# Patient Record
Sex: Female | Born: 1975 | Race: White | Hispanic: No | Marital: Married | Smoking: Current every day smoker
Health system: Southern US, Community
[De-identification: ages and names within clinical notes are randomized; demographics above are authoritative.]

## PROBLEM LIST (undated history)

## (undated) ENCOUNTER — Emergency Department (HOSPITAL_COMMUNITY): Admission: EM | Payer: Self-pay | Source: Home / Self Care

## (undated) DIAGNOSIS — E079 Disorder of thyroid, unspecified: Secondary | ICD-10-CM

## (undated) DIAGNOSIS — R7989 Other specified abnormal findings of blood chemistry: Secondary | ICD-10-CM

## (undated) DIAGNOSIS — R32 Unspecified urinary incontinence: Secondary | ICD-10-CM

## (undated) DIAGNOSIS — N809 Endometriosis, unspecified: Secondary | ICD-10-CM

## (undated) DIAGNOSIS — N946 Dysmenorrhea, unspecified: Secondary | ICD-10-CM

## (undated) DIAGNOSIS — E559 Vitamin D deficiency, unspecified: Secondary | ICD-10-CM

## (undated) DIAGNOSIS — N8003 Adenomyosis of the uterus: Secondary | ICD-10-CM

## (undated) DIAGNOSIS — N979 Female infertility, unspecified: Secondary | ICD-10-CM

## (undated) DIAGNOSIS — N800 Endometriosis of uterus: Secondary | ICD-10-CM

## (undated) DIAGNOSIS — G510 Bell's palsy: Secondary | ICD-10-CM

## (undated) DIAGNOSIS — E785 Hyperlipidemia, unspecified: Secondary | ICD-10-CM

## (undated) DIAGNOSIS — R945 Abnormal results of liver function studies: Secondary | ICD-10-CM

## (undated) DIAGNOSIS — IMO0002 Reserved for concepts with insufficient information to code with codable children: Secondary | ICD-10-CM

## (undated) HISTORY — DX: Vitamin D deficiency, unspecified: E55.9

## (undated) HISTORY — PX: SALPINGECTOMY: SHX328

## (undated) HISTORY — DX: Endometriosis of uterus: N80.0

## (undated) HISTORY — DX: Hyperlipidemia, unspecified: E78.5

## (undated) HISTORY — DX: Disorder of thyroid, unspecified: E07.9

## (undated) HISTORY — DX: Bell's palsy: G51.0

## (undated) HISTORY — DX: Endometriosis, unspecified: N80.9

## (undated) HISTORY — DX: Reserved for concepts with insufficient information to code with codable children: IMO0002

## (undated) HISTORY — DX: Female infertility, unspecified: N97.9

## (undated) HISTORY — DX: Abnormal results of liver function studies: R94.5

## (undated) HISTORY — DX: Dysmenorrhea, unspecified: N94.6

## (undated) HISTORY — DX: Unspecified urinary incontinence: R32

## (undated) HISTORY — DX: Other specified abnormal findings of blood chemistry: R79.89

## (undated) HISTORY — DX: Adenomyosis of the uterus: N80.03

---

## 1995-12-18 HISTORY — PX: ECTOPIC PREGNANCY SURGERY: SHX613

## 1999-02-27 ENCOUNTER — Emergency Department (HOSPITAL_COMMUNITY): Admission: EM | Admit: 1999-02-27 | Discharge: 1999-02-27 | Payer: Self-pay | Admitting: Emergency Medicine

## 2010-12-17 LAB — HM PAP SMEAR

## 2011-06-27 ENCOUNTER — Emergency Department (HOSPITAL_COMMUNITY)
Admission: EM | Admit: 2011-06-27 | Discharge: 2011-06-28 | Disposition: A | Discharge status: Home / Self Care | Payer: BC Managed Care – PPO | Attending: Emergency Medicine | Admitting: Emergency Medicine

## 2011-06-27 ENCOUNTER — Emergency Department (HOSPITAL_COMMUNITY): Payer: Self-pay | Attending: Emergency Medicine

## 2011-06-27 DIAGNOSIS — E7800 Pure hypercholesterolemia, unspecified: Secondary | ICD-10-CM | POA: Insufficient documentation

## 2011-06-27 DIAGNOSIS — R0789 Other chest pain: Secondary | ICD-10-CM | POA: Insufficient documentation

## 2011-06-27 DIAGNOSIS — Z975 Presence of (intrauterine) contraceptive device: Secondary | ICD-10-CM | POA: Insufficient documentation

## 2011-06-27 DIAGNOSIS — E039 Hypothyroidism, unspecified: Secondary | ICD-10-CM | POA: Insufficient documentation

## 2011-06-27 DIAGNOSIS — F17200 Nicotine dependence, unspecified, uncomplicated: Secondary | ICD-10-CM | POA: Insufficient documentation

## 2011-06-27 LAB — DIFFERENTIAL
Basophils Absolute: 0.1 10*3/uL (ref 0.0–0.1)
Basophils Relative: 1 % (ref 0–1)
Eosinophils Absolute: 0.7 10*3/uL (ref 0.0–0.7)
Eosinophils Relative: 6 % — ABNORMAL HIGH (ref 0–5)
Monocytes Absolute: 0.9 10*3/uL (ref 0.1–1.0)
Monocytes Relative: 7 % (ref 3–12)

## 2011-06-28 ENCOUNTER — Emergency Department (HOSPITAL_COMMUNITY): Payer: BC Managed Care – PPO

## 2011-06-28 LAB — CK TOTAL AND CKMB (NOT AT ARMC)
CK, MB: 2 ng/mL (ref 0.3–4.0)
Total CK: 90 U/L (ref 7–177)

## 2011-06-28 LAB — CBC
MCH: 30.7 pg (ref 26.0–34.0)
MCHC: 34.7 g/dL (ref 30.0–36.0)
Platelets: 276 10*3/uL (ref 150–400)
RDW: 13.3 % (ref 11.5–15.5)

## 2011-06-28 LAB — BASIC METABOLIC PANEL
Calcium: 9.4 mg/dL (ref 8.4–10.5)
GFR calc Af Amer: >60 mL/min (ref >60)
GFR calc non Af Amer: >60 mL/min (ref >60)
Glucose, Bld: 111 mg/dL — ABNORMAL HIGH (ref 70–99)
Sodium: 140 mEq/L (ref 135–145)

## 2011-06-28 MED ORDER — IOHEXOL 300 MG/ML  SOLN
100.0000 mL | Freq: Once | INTRAMUSCULAR | Status: AC | PRN
  Administered 2011-06-28: 100 mL via INTRAVENOUS

## 2011-07-23 ENCOUNTER — Encounter (INDEPENDENT_AMBULATORY_CARE_PROVIDER_SITE_OTHER): Payer: Self-pay | Admitting: General Surgery

## 2011-07-23 ENCOUNTER — Ambulatory Visit (INDEPENDENT_AMBULATORY_CARE_PROVIDER_SITE_OTHER): Payer: BC Managed Care – PPO | Admitting: General Surgery

## 2011-07-23 VITALS — BP 120/82 | HR 68 | Temp 98.1°F | Ht 68.0 in | Wt 205.0 lb

## 2011-07-23 DIAGNOSIS — R109 Unspecified abdominal pain: Secondary | ICD-10-CM

## 2011-07-23 DIAGNOSIS — R1084 Generalized abdominal pain: Secondary | ICD-10-CM

## 2011-07-23 NOTE — Progress Notes (Signed)
Tammy Gibson is a 35 y.o. female.    Chief Complaint  Patient presents with  . Abdominal Pain    HPI HPI Is is a 35 year old female who is otherwise fairly healthy who has about an 8 month history of abdominal bloating, mucus in her stool, and worsening constipation. Previously she had bowel movements every 3 days this has changed over the last 8 months ago. She is also describes bloating that is very significant and causes her some diffuse abdominal cramping and pain every time she eats. She does associate some nausea and occasional emesis with this also. She has no fevers. She went on a diet previously lost about 8 pounds since then she has gained 18 pounds back. She was seen at the [facility] emergency room recently with what sounds like a right upper quadrant and chest pain. She underwent a CT angiogram for chest which was negative and was discharged home. She had been seen by her primary care physician Dr. Marina Goodell. He is sent her for an ultrasound which shows no abnormalities. She has no gallstones and there is no thickening of her gallbladder wall as well as her common bile duct is within normal limits. Subsequent to this she underwent a HIDA scan which shows an ejection fraction of 73%. There was no other real abnormalities she reports and the stomach report says that she had some mild cramping as well as some eructation. She comes in today to discuss having her gallbladder removed. She is not sure that this is her gallbladder and was questioning me as to why she would need her gallbladder out before I began discussing this with her.  Past Medical History  Diagnosis Date  . Thyroid disease   . Hyperlipidemia   . Abdominal pain     Past Surgical History  Procedure Date  . Ectopic pregnancy surgery     Family History  Problem Relation Age of Onset  . Hypertension Father   . Hyperlipidemia Father   . Thyroid disease Sister     Social History History  Substance Use Topics  . Smoking  status: Current Everyday Smoker -- 0.5 packs/day    Types: Cigarettes  . Smokeless tobacco: Not on file  . Alcohol Use: Yes    No Known Allergies  Current Outpatient Prescriptions  Medication Sig Dispense Refill  . levothyroxine (SYNTHROID, LEVOTHROID) 75 MCG tablet Take 75 mcg by mouth daily.        Marland Kitchen liothyronine (CYTOMEL) 25 MCG tablet Take 25 mcg by mouth 2 (two) times daily.        . simvastatin (ZOCOR) 20 MG tablet Take 20 mg by mouth at bedtime.          Review of Systems Review of Systems  Constitutional: Positive for malaise/fatigue. Negative for weight loss (weight gain).  HENT: Negative.   Eyes: Negative.   Respiratory: Negative.   Cardiovascular: Negative.   Gastrointestinal: Positive for abdominal pain and constipation.  Genitourinary: Negative.   Musculoskeletal: Negative.   Skin: Negative.   Neurological: Negative.   Endo/Heme/Allergies: Negative.   Psychiatric/Behavioral: Negative.     Physical Exam Physical Exam  Constitutional: She appears well-developed and well-nourished.  Eyes: No scleral icterus.  Cardiovascular: Normal rate, regular rhythm and normal heart sounds.   Respiratory: Effort normal and breath sounds normal. She has no wheezes. She has no rales.  GI: Soft. Bowel sounds are normal. She exhibits no distension and no mass. There is no tenderness.     Blood pressure  120/82, pulse 68, temperature 98.1 F (36.7 C), temperature source Temporal, height 5\' 8"  (1.727 m), weight 205 lb (92.987 kg).  Assessment/Plan Abdominal pain, bloating and constipation  She has a variety of different gastrointestinal complaints. There is a chance that certainly some of these might be referable to her gallbladder. She does however have normal ultrasound as well as in essentially normal HIDA scan also. I think the best course of action would be to have her seen by gastroenterology and I've made at referral prior to considering having a cholecystectomy at this  point. She was very happy with this plan I will see her back as needed if her symptoms do not improve or she did not able to find a cause of her symptoms.  I do think the episodes she had of right upper quadrant pain and chest pain and some of the symptoms that she has may be referable to her gallbladder and I discussed that with her today. I think it would be best for her to be evaluated by GI before proceeding with surgery.  Tammy Gibson 07/23/2011, 11:01 AM

## 2011-12-18 HISTORY — PX: IUD REMOVAL: SHX5392

## 2012-08-13 ENCOUNTER — Other Ambulatory Visit (HOSPITAL_COMMUNITY): Payer: Self-pay | Admitting: Internal Medicine

## 2012-08-13 ENCOUNTER — Ambulatory Visit (HOSPITAL_COMMUNITY)
Admission: RE | Admit: 2012-08-13 | Discharge: 2012-08-13 | Disposition: A | Discharge status: Home / Self Care | Payer: BC Managed Care – PPO | Source: Ambulatory Visit | Attending: Internal Medicine | Admitting: Internal Medicine

## 2012-08-13 DIAGNOSIS — F17200 Nicotine dependence, unspecified, uncomplicated: Secondary | ICD-10-CM

## 2012-08-13 DIAGNOSIS — I10 Essential (primary) hypertension: Secondary | ICD-10-CM

## 2012-08-14 ENCOUNTER — Other Ambulatory Visit: Payer: Self-pay | Admitting: Internal Medicine

## 2012-08-14 DIAGNOSIS — E0789 Other specified disorders of thyroid: Secondary | ICD-10-CM

## 2012-08-19 ENCOUNTER — Ambulatory Visit
Admission: RE | Admit: 2012-08-19 | Discharge: 2012-08-19 | Disposition: A | Discharge status: Home / Self Care | Payer: BC Managed Care – PPO | Source: Ambulatory Visit | Attending: Internal Medicine | Admitting: Internal Medicine

## 2012-08-19 DIAGNOSIS — E0789 Other specified disorders of thyroid: Secondary | ICD-10-CM

## 2013-10-02 ENCOUNTER — Encounter: Payer: Self-pay | Admitting: Internal Medicine

## 2013-10-20 ENCOUNTER — Other Ambulatory Visit: Payer: Self-pay

## 2013-10-21 ENCOUNTER — Other Ambulatory Visit: Payer: Self-pay | Admitting: Internal Medicine

## 2013-10-21 ENCOUNTER — Other Ambulatory Visit: Payer: BC Managed Care – PPO

## 2013-10-28 ENCOUNTER — Ambulatory Visit: Payer: BC Managed Care – PPO | Admitting: Physician Assistant

## 2013-10-28 ENCOUNTER — Encounter: Payer: Self-pay | Admitting: Physician Assistant

## 2013-10-28 VITALS — BP 112/68 | HR 88 | Temp 98.6°F | Resp 16 | Wt 196.4 lb

## 2013-10-28 DIAGNOSIS — H109 Unspecified conjunctivitis: Secondary | ICD-10-CM

## 2013-10-28 DIAGNOSIS — E039 Hypothyroidism, unspecified: Secondary | ICD-10-CM

## 2013-10-28 DIAGNOSIS — G510 Bell's palsy: Secondary | ICD-10-CM

## 2013-10-28 DIAGNOSIS — E559 Vitamin D deficiency, unspecified: Secondary | ICD-10-CM

## 2013-10-28 DIAGNOSIS — E785 Hyperlipidemia, unspecified: Secondary | ICD-10-CM

## 2013-10-28 LAB — LIPID PANEL
HDL: 35 mg/dL — ABNORMAL LOW (ref >39)
LDL Cholesterol: 103 mg/dL — ABNORMAL HIGH (ref 0–99)
Total CHOL/HDL Ratio: 4.3 Ratio
Triglycerides: 67 mg/dL (ref <150)

## 2013-10-28 MED ORDER — PREDNISONE 20 MG PO TABS: 20.0000 mg | ORAL_TABLET | ORAL | Status: DC

## 2013-10-28 MED ORDER — NEOMYCIN-POLYMYXIN-DEXAMETH 0.1 % OP SUSP: 1.0000 [drp] | Freq: Four times a day (QID) | OPHTHALMIC | Status: DC

## 2013-10-28 MED ORDER — ACYCLOVIR 800 MG PO TABS: 800.0000 mg | ORAL_TABLET | Freq: Every day | ORAL | Status: DC

## 2013-10-28 NOTE — Progress Notes (Signed)
Subjective:     Patient ID: Tammy Gibson, female   DOB: 1976/06/13, 37 y.o.   MRN: [id number]  Eye Problem  The left eye is affected.This is a new problem. The current episode started yesterday. The problem occurs constantly. The problem has been gradually improving. There was no injury mechanism. The patient is experiencing no pain. There is no known exposure to pink eye. She does not wear contacts. Associated symptoms include blurred vision (Dropping eye lid and funny sensation on her left side of her face. ), eye redness and nausea. Pertinent negatives include no eye discharge, double vision, fever, foreign body sensation, itching, photophobia, recent URI, vomiting or weakness. She has tried an eye patch for the symptoms. The treatment provided no relief.   Past Medical History  Diagnosis Date  . Thyroid disease   . Hyperlipidemia   . Abdominal pain   . Endometriosis   . Vitamin D deficiency      Medication List       This list is accurate as of: 10/28/13  3:27 PM.  Always use your most recent med list.               levothyroxine 75 MCG tablet  Commonly known as:  SYNTHROID, LEVOTHROID  Take 150 mcg by mouth daily.     liothyronine 25 MCG tablet  Commonly known as:  CYTOMEL  Take 25 mcg by mouth 2 (two) times daily.     rosuvastatin 10 MG tablet  Commonly known as:  CRESTOR  Take 10 mg by mouth daily.     simvastatin 20 MG tablet  Commonly known as:  ZOCOR  Take 20 mg by mouth at bedtime.         Review of Systems  Constitutional: Negative.  Negative for fever.  HENT: Negative.   Eyes: Positive for blurred vision (Dropping eye lid and funny sensation on her left side of her face. ) and redness. Negative for double vision, photophobia, pain, discharge, itching and visual disturbance.  Respiratory: Negative.   Cardiovascular: Negative.   Gastrointestinal: Positive for nausea. Negative for vomiting.  Skin: Negative for itching.  Neurological: Positive for facial  asymmetry and numbness (left side of face). Negative for dizziness, seizures, syncope, speech difficulty, weakness and headaches.       Objective:   Physical Exam  Constitutional: She is oriented to person, place, and time. She appears well-developed and well-nourished.  HENT:  Head: Normocephalic and atraumatic.  Right Ear: External ear normal.  Left Ear: External ear normal.  Eyes: Conjunctivae are normal. Pupils are equal, round, and reactive to light.  Neck: Normal range of motion. Neck supple.  Cardiovascular: Normal rate and regular rhythm.   Pulmonary/Chest: Effort normal. She has wheezes (left side).  Abdominal: Soft. Bowel sounds are normal.  Musculoskeletal: Normal range of motion.  Neurological: She is alert and oriented to person, place, and time. She has normal strength and normal reflexes. She displays a negative Romberg sign.  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        Assessment and Plan:     1. Bell palsy Acyclovir 800mg  TID #50 no refills - predniSONE (DELTASONE) 20 MG tablet; Take 1 tablet (20 mg total) by mouth See admin instructions. Take one tablet three times daily with food for 3 days, take one tablet two times daily with food for 3 days, take one tablet daily for 5 days.  Dispense: 20 tablet; Refill:  0 Suggest following up with eye doctor.   2. Conjunctivitis - neomycin-polymyxin-dexamethasone (MAXITROL) 0.1 % ophthalmic suspension; Place 1-2 drops into the left eye 4 (four) times daily.  Dispense: 5 mL; Refill: 0  3. Unspecified hypothyroidism Recent Medication change. Last TSH was 34.389 and thyroid was increased to QD - TSH  4. Other and unspecified hyperlipidemia - Lipid panel  5. Unspecified vitamin D deficiency - Vit D  25 hydroxy (rtn osteoporosis monitoring)

## 2013-10-28 NOTE — Patient Instructions (Signed)
Bell's Palsy  Bell's palsy is a condition in which the muscles on one side of the face cannot move (paralysis). This is because the nerves in the face are paralyzed. It is most often thought to be caused by a virus. The virus causes swelling of the nerve that controls movement on one side of the face. The nerve travels through a tight space surrounded by bone. When the nerve swells, it can be compressed by the bone. This results in damage to the protective covering around the nerve. This damage interferes with how the nerve communicates with the muscles of the face. As a result, it can cause weakness or paralysis of the facial muscles.   Injury (trauma), tumor, and surgery may cause Bell's palsy, but most of the time the cause is unknown. It is a relatively common condition. It starts suddenly (abrupt onset) with the paralysis usually ending within 2 days. Bell's palsy is not dangerous. But because the eye does not close properly, you may need care to keep the eye from getting dry. This can include splinting (to keep the eye shut) or moistening with artificial tears. Bell's palsy very seldom occurs on both sides of the face at the same time.  SYMPTOMS    Eyebrow sagging.   Drooping of the eyelid and corner of the mouth.   Inability to close one eye.   Loss of taste on the front of the tongue.   Sensitivity to loud noises.  TREATMENT   The treatment is usually non-surgical. If the patient is seen within the first 24 to 48 hours, a short course of steroids may be prescribed, in an attempt to shorten the length of the condition. Antiviral medicines may also be used with the steroids, but it is unclear if they are helpful.   You will need to protect your eye, if you cannot close it. The cornea (clear covering over your eye) will become dry and can be damaged. Artificial tears can be used to keep your eye moist. Glasses or an eye patch should be worn to protect your eye.  PROGNOSIS   Recovery is variable, ranging  from days to months. Although the problem usually goes away completely (about 80% of cases resolve), predicting the outcome is impossible. Most people improve within 3 weeks of when the symptoms began. Improvement may continue for 3 to 6 months. A small number of people have moderate to severe weakness that is permanent.   HOME CARE INSTRUCTIONS    If your caregiver prescribed medication to reduce swelling in the nerve, use as directed. Do not stop taking the medication unless directed by your caregiver.   Use moisturizing eye drops as needed to prevent drying of your eye, as directed by your caregiver.   Protect your eye, as directed by your caregiver.   Use facial massage and exercises, as directed by your caregiver.   Perform your normal activities, and get your normal rest.  SEEK IMMEDIATE MEDICAL CARE IF:    There is pain, redness or irritation in the eye.   You or your child has an oral temperature above 102 F (38.9 C), not controlled by medicine.  MAKE SURE YOU:    Understand these instructions.   Will watch your condition.   Will get help right away if you are not doing well or get worse.  Document Released: 12/03/2005 Document Revised: 02/25/2012 Document Reviewed: 12/12/2009  [employer] Patient Information 2014 [employer].

## 2013-10-29 ENCOUNTER — Other Ambulatory Visit: Payer: Self-pay

## 2013-11-18 ENCOUNTER — Other Ambulatory Visit: Payer: Self-pay | Admitting: Physician Assistant

## 2013-11-18 ENCOUNTER — Ambulatory Visit (INDEPENDENT_AMBULATORY_CARE_PROVIDER_SITE_OTHER): Payer: BC Managed Care – PPO | Admitting: Physician Assistant

## 2013-11-18 ENCOUNTER — Encounter: Payer: Self-pay | Admitting: Physician Assistant

## 2013-11-18 VITALS — BP 118/72 | HR 84 | Temp 98.2°F | Resp 16 | Ht 68.0 in | Wt 193.0 lb

## 2013-11-18 DIAGNOSIS — R5381 Other malaise: Secondary | ICD-10-CM

## 2013-11-18 DIAGNOSIS — R7401 Elevation of levels of liver transaminase levels: Secondary | ICD-10-CM

## 2013-11-18 DIAGNOSIS — E039 Hypothyroidism, unspecified: Secondary | ICD-10-CM | POA: Insufficient documentation

## 2013-11-18 DIAGNOSIS — E559 Vitamin D deficiency, unspecified: Secondary | ICD-10-CM

## 2013-11-18 DIAGNOSIS — R5383 Other fatigue: Secondary | ICD-10-CM

## 2013-11-18 DIAGNOSIS — R7402 Elevation of levels of lactic acid dehydrogenase (LDH): Secondary | ICD-10-CM

## 2013-11-18 DIAGNOSIS — E079 Disorder of thyroid, unspecified: Secondary | ICD-10-CM

## 2013-11-18 DIAGNOSIS — E785 Hyperlipidemia, unspecified: Secondary | ICD-10-CM

## 2013-11-18 DIAGNOSIS — R7303 Prediabetes: Secondary | ICD-10-CM

## 2013-11-18 DIAGNOSIS — R7309 Other abnormal glucose: Secondary | ICD-10-CM

## 2013-11-18 DIAGNOSIS — H02402 Unspecified ptosis of left eyelid: Secondary | ICD-10-CM

## 2013-11-18 DIAGNOSIS — H02409 Unspecified ptosis of unspecified eyelid: Secondary | ICD-10-CM

## 2013-11-18 LAB — CBC WITH DIFFERENTIAL/PLATELET
Basophils Relative: 1 % (ref 0–1)
Eosinophils Absolute: 0.4 10*3/uL (ref 0.0–0.7)
Hemoglobin: 14.7 g/dL (ref 12.0–15.0)
MCH: 29.2 pg (ref 26.0–34.0)
MCHC: 33.6 g/dL (ref 30.0–36.0)
Monocytes Absolute: 0.7 10*3/uL (ref 0.1–1.0)
Monocytes Relative: 7 % (ref 3–12)
Neutrophils Relative %: 60 % (ref 43–77)

## 2013-11-18 LAB — HEPATIC FUNCTION PANEL
ALT: 83 U/L — ABNORMAL HIGH (ref 0–35)
Total Protein: 6.8 g/dL (ref 6.0–8.3)

## 2013-11-18 LAB — LIPID PANEL
Cholesterol: 203 mg/dL — ABNORMAL HIGH (ref 0–200)
Triglycerides: 85 mg/dL (ref <150)

## 2013-11-18 LAB — BASIC METABOLIC PANEL WITH GFR
BUN: 15 mg/dL (ref 6–23)
Calcium: 9.7 mg/dL (ref 8.4–10.5)
GFR, Est African American: >89 mL/min
Glucose, Bld: 89 mg/dL (ref 70–99)

## 2013-11-18 NOTE — Patient Instructions (Signed)
Add benefiber What is the TMJ? The temporomandibular (tem-PUH-ro-man-DIB-yoo-ler) joint, or the TMJ, connects the upper and lower jawbones. This joint allows the jaw to open wide and move back and forth when you chew, talk, or yawn.There are also several muscles that help this joint move. There can be muscle tightness and pain in the muscle that can cause several symptoms.  What causes TMJ pain? There are many causes of TMJ pain. Repeated chewing (for example, chewing gum) and clenching your teeth can cause pain in the joint. Some TMJ pain has no obvious cause. What can I do to ease the pain? There are many things you can do to help your pain get better. When you have pain:  Eat soft foods and stay away from chewy foods (for example, taffy) Try to use both sides of your mouth to chew Don't chew gum Don't open your mouth wide (for example, during yawning or singing) Don't bite your cheeks or fingernails Lower your amount of stress and worry Applying a warm, damp washcloth to the joint may help. Over-the-counter pain medicines such as ibuprofen (one brand: Advil) or acetaminophen (one brand: Tylenol) might also help. Do not use these medicines if you are allergic to them or if your doctor told you not to use them. How can I stop the pain from coming back? When your pain is better, you can do these exercises to make your muscles stronger and to keep the pain from coming back:  Resisted mouth opening: Place your thumb or two fingers under your chin and open your mouth slowly, pushing up lightly on your chin with your thumb. Hold for three to six seconds. Close your mouth slowly. Resisted mouth closing: Place your thumbs under your chin and your two index fingers on the ridge between your mouth and the bottom of your chin. Push down lightly on your chin as you close your mouth. Tongue up: Slowly open and close your mouth while keeping the tongue touching the roof of the mouth. Side-to-side jaw  movement: Place an object about one fourth of an inch thick (for example, two tongue depressors) between your front teeth. Slowly move your jaw from side to side. Increase the thickness of the object as the exercise becomes easier Forward jaw movement: Place an object about one fourth of an inch thick between your front teeth and move the bottom jaw forward so that the bottom teeth are in front of the top teeth. Increase the thickness of the object as the exercise becomes easier. These exercises should not be painful. If it hurts to do these exercises, stop doing them and talk to your family doctor.  Diet for Gastroesophageal Reflux Disease, Adult Reflux (acid reflux) is when acid from your stomach flows up into the esophagus. When acid comes in contact with the esophagus, the acid causes irritation and soreness (inflammation) in the esophagus. When reflux happens often or so severely that it causes damage to the esophagus, it is called gastroesophageal reflux disease (GERD). Nutrition therapy can help ease the discomfort of GERD. FOODS OR DRINKS TO AVOID OR LIMIT  Smoking or chewing tobacco. Nicotine is one of the most potent stimulants to acid production in the gastrointestinal tract.  Caffeinated and decaffeinated coffee and black tea.  Regular or low-calorie carbonated beverages or energy drinks (caffeine-free carbonated beverages are allowed).   Strong spices, such as black pepper, white pepper, red pepper, cayenne, curry powder, and chili powder.  Peppermint or spearmint.  Chocolate.  High-fat foods, including  meats and fried foods. Extra added fats including oils, butter, salad dressings, and nuts. Limit these to less than 8 tsp per day.  Fruits and vegetables if they are not tolerated, such as citrus fruits or tomatoes.  Alcohol.  Any food that seems to aggravate your condition. If you have questions regarding your diet, call your caregiver or a registered dietitian. OTHER THINGS  THAT MAY HELP GERD INCLUDE:   Eating your meals slowly, in a relaxed setting.  Eating 5 to 6 small meals per day instead of 3 large meals.  Eliminating food for a period of time if it causes distress.  Not lying down until 3 hours after eating a meal.  Keeping the head of your bed raised 6 to 9 inches (15 to 23 cm) by using a foam wedge or blocks under the legs of the bed. Lying flat may make symptoms worse.  Being physically active. Weight loss may be helpful in reducing reflux in overweight or obese adults.  Wear loose fitting clothing EXAMPLE MEAL PLAN This meal plan is approximately 2,000 calories based on https://www.bernard.org/ meal planning guidelines. Breakfast   cup cooked oatmeal.  1 cup strawberries.  1 cup low-fat milk.  1 oz almonds. Snack  1 cup cucumber slices.  6 oz yogurt (made from low-fat or fat-free milk). Lunch  2 slice whole-wheat bread.  2 oz sliced [place].  2 tsp mayonnaise.  1 cup blueberries.  1 cup snap peas. Snack  6 whole-wheat crackers.  1 oz string cheese. Dinner   cup brown rice.  1 cup mixed veggies.  1 tsp olive oil.  3 oz grilled fish. Document Released: 12/03/2005 Document Revised: 02/25/2012 Document Reviewed: 10/19/2011 [facility] Patient Information 2014 [name], [place].

## 2013-11-18 NOTE — Progress Notes (Addendum)
HPI Patient presents for 3 month follow up with hypertension, hyperlipidemia, prediabetes and vitamin D. Patient's blood pressure has been controlled at home. Patient denies chest pain, shortness of breath, dizziness.  Patient's cholesterol is diet controlled. In addition they are on crestor now  and denies myalgias. The cholesterol last visit was. The patient has been working on diet and exercise for prediabetes, and denies changes in vision, polys, and paresthesias.   Patient last visit was treated for bell's palsy, she states her eye is better however she continues to have ptosis when she is tired and gets fatigued as she does things through the day. She has stated that she has had double vision as well.  She also states that she has dyspareunia and needs to see OB/GYN. She states her menses are abnormal and has a history of endometriosis.  Patient is on Vitamin D supplement.  Current Medications:  Current Outpatient Prescriptions on File Prior to Visit  Medication Sig Dispense Refill  . levothyroxine (SYNTHROID, LEVOTHROID) 75 MCG tablet Take 150 mcg by mouth daily.       . rosuvastatin (CRESTOR) 10 MG tablet Take 10 mg by mouth daily.       No current facility-administered medications on file prior to visit.   Medical History:  Past Medical History  Diagnosis Date  . Thyroid disease   . Hyperlipidemia   . Abdominal pain   . Endometriosis   . Vitamin D deficiency    Allergies: No Known Allergies  ROS Constitutional: + headaches, insomnia, fatigue, decreased appetitie  Denies fever, chills, weight loss/gain, night sweats Eyes: +ptosis, diplopia Denies redness, discharge, itchy, watery eyes.  ENT: Denies discharge, congestion, post nasal drip, sore throat, earache, dental pain, Tinnitus, Vertigo, Sinus pain, snoring.  Cardio: Denies chest pain, palpitations, irregular heartbeat,  dyspnea, diaphoresis, orthopnea, PND, claudication, edema Respiratory: denies cough, dyspnea,pleurisy,  hoarseness, wheezing.  Gastrointestinal: Denies dysphagia, heartburn,  water brash, pain, cramps, nausea, vomiting, bloating, diarrhea, constipation, hematemesis, melena, hematochezia,  hemorrhoids Genitourinary: + abnormal periods Denies dysuria, frequency, urgency, nocturia, hesitancy, discharge, hematuria, flank pain Musculoskeletal: Denies arthralgia, myalgia, stiffness, Jt. Swelling, pain, limp, and strain/sprain. Skin: Denies pruritis, rash, hives, warts, acne, eczema, changing in skin lesion Neuro: + weakness with stairs and repeated movement Denies tremor, incoordination, spasms, paresthesia, pain Psychiatric: Denies confusion, memory loss, sensory loss Endocrine: Denies change in weight, skin, hair change, nocturia, and paresthesia, Diabetic Polys, Denies visual blurring, hyper /hypo glycemic episodes.  Heme/Lymph: Denies Excessive bleeding, bruising, enlarged lymph nodes  Family history- Review and unchanged Social history- Review and unchanged Physical Exam: Filed Vitals:   11/18/13 0850  BP: 118/72  Pulse: 84  Temp: 98.2 F (36.8 C)  Resp: 16   Filed Weights   11/18/13 0850  Weight: 193 lb (87.544 kg)   General Appearance: Well nourished, in no apparent distress. Eyes: PERRLA, EOMs, conjunctiva no swelling or erythema, normal fundi and vessels. Sinuses: No Frontal/maxillary tenderness ENT/Mouth: Ext aud canals clear, with TMs without erythema, bulging.No erythema, swelling, or exudate on post pharynx.  Tonsils not swollen or erythematous. Hearing normal.  Neck: Supple, thyroid normal.  Respiratory: Respiratory effort normal, BS equal bilaterally without rales, rhonchi, wheezing or stridor.  Cardio: Heart sounds normal, regular rate and rhythm without murmurs, rubs or gallops. Peripheral pulses brisk and equal bilaterally, without edema.  Abdomen: Flat, soft, with bowel sounds. Non tender, no guarding, rebound, hernias, masses, or organomegaly.  Lymphatics: Non tender  without lymphadenopathy.  Musculoskeletal: Full ROM all peripheral extremities, joint  stability, 5/5 strength, and normal gait. Skin: Warm, dry without rashes, lesions, ecchymosis.  Neuro: Cranial nerves intact, reflexes equal bilaterally. Normal muscle tone, no cerebellar symptoms. Sensation intact.  Psych: Awake and oriented X 3, normal affect, Insight and Judgment appropriate.   Assessment and Plan:  Hypertension: Continue medication, monitor blood pressure at home. Continue DASH diet. Cholesterol: Continue diet and exercise. Check cholesterol.  Pre-diabetes-Continue diet and exercise. Check A1C Vitamin D Def- check level and continue medications.  Hypothyroid- Still trying to get at goal- very possible that some of her symptoms may be from her thyroid- will check today with other labs.  Ptosis, diplopia- check labs to rule out MG, Lupus, etc Dysparunia with history of endometriosis- follow up with OB/GYN ASAP  Quentin Mulling 9:10 AM   Patient's LFTs are elevated- added Hep panel, ceruloplasmin, and Anti smooth/mitrochronrial and informed patient to decrease alcohol and tylenol and she will follow up in one month.

## 2013-11-19 LAB — ANA: Anti Nuclear Antibody(ANA): NEGATIVE

## 2013-11-19 LAB — HEMOGLOBIN A1C: Mean Plasma Glucose: 117 mg/dL — ABNORMAL HIGH (ref <117)

## 2013-11-19 LAB — INSULIN, FASTING: Insulin fasting, serum: 15 u[IU]/mL (ref 3–28)

## 2013-11-19 LAB — ANTI-DNA ANTIBODY, DOUBLE-STRANDED: ds DNA Ab: 2 IU/mL (ref <30)

## 2013-11-20 ENCOUNTER — Encounter: Payer: Self-pay | Admitting: Internal Medicine

## 2013-11-20 LAB — ACETYLCHOLINE RECEPTOR, BINDING: A CHR BINDING ABS: <0.3 nmol/L (ref <=0.30)

## 2013-11-20 LAB — STRIATED MUSCLE ANTIBODY: Striated Muscle Ab: <1:40 {titer}

## 2013-11-20 NOTE — Addendum Note (Signed)
Addended by: Quentin Mulling R on: 11/20/2013 12:42 PM   Modules accepted: Orders

## 2013-11-24 ENCOUNTER — Encounter: Payer: Self-pay | Admitting: Internal Medicine

## 2013-11-24 LAB — HEPATITIS PANEL, ACUTE
HCV Ab: NEGATIVE
Hep B C IgM: NONREACTIVE

## 2013-11-25 LAB — CERULOPLASMIN: Ceruloplasmin: 26 mg/dL (ref 20–60)

## 2013-11-26 LAB — MITOCHONDRIAL/SMOOTH MUSCLE AB PNL
Mitochondrial M2 Ab, IgG: 0.66 (ref <0.91)
Smooth Muscle Ab: 30 U — ABNORMAL HIGH (ref <20)

## 2013-11-27 ENCOUNTER — Encounter: Payer: Self-pay | Admitting: Internal Medicine

## 2013-11-27 ENCOUNTER — Telehealth: Payer: Self-pay

## 2013-11-27 NOTE — Telephone Encounter (Signed)
Spoke with patient regarding lab results and instructions she she did receive via my chart and has a follow up appt scheduled , the GI doctor has already contacted her

## 2013-11-27 NOTE — Addendum Note (Signed)
Addended by: Quentin Mulling R on: 11/27/2013 08:36 AM   Modules accepted: Orders

## 2013-12-01 ENCOUNTER — Telehealth: Payer: Self-pay | Admitting: Gynecology

## 2013-12-01 NOTE — Telephone Encounter (Signed)
Patient is returning starlas call about a incoming Referral. Told her starla was off but i would have carol call her since she was who is dealing with the referrals.

## 2013-12-14 ENCOUNTER — Encounter: Payer: Self-pay | Admitting: Internal Medicine

## 2013-12-29 ENCOUNTER — Other Ambulatory Visit: Payer: Self-pay

## 2013-12-29 ENCOUNTER — Encounter: Payer: Self-pay | Admitting: Physician Assistant

## 2013-12-29 ENCOUNTER — Other Ambulatory Visit: Payer: Self-pay | Admitting: Physician Assistant

## 2013-12-29 ENCOUNTER — Ambulatory Visit (HOSPITAL_COMMUNITY)
Admission: RE | Admit: 2013-12-29 | Discharge: 2013-12-29 | Disposition: A | Discharge status: Home / Self Care | Payer: BC Managed Care – PPO | Source: Ambulatory Visit | Attending: Physician Assistant | Admitting: Physician Assistant

## 2013-12-29 ENCOUNTER — Ambulatory Visit (INDEPENDENT_AMBULATORY_CARE_PROVIDER_SITE_OTHER): Payer: BC Managed Care – PPO | Admitting: Physician Assistant

## 2013-12-29 VITALS — BP 110/70 | HR 76 | Temp 98.7°F | Resp 16 | Ht 68.0 in | Wt 198.0 lb

## 2013-12-29 DIAGNOSIS — R059 Cough, unspecified: Secondary | ICD-10-CM

## 2013-12-29 DIAGNOSIS — I10 Essential (primary) hypertension: Secondary | ICD-10-CM

## 2013-12-29 DIAGNOSIS — R05 Cough: Secondary | ICD-10-CM

## 2013-12-29 DIAGNOSIS — E039 Hypothyroidism, unspecified: Secondary | ICD-10-CM

## 2013-12-29 DIAGNOSIS — R062 Wheezing: Secondary | ICD-10-CM | POA: Insufficient documentation

## 2013-12-29 DIAGNOSIS — H532 Diplopia: Secondary | ICD-10-CM

## 2013-12-29 DIAGNOSIS — R490 Dysphonia: Secondary | ICD-10-CM

## 2013-12-29 DIAGNOSIS — H02409 Unspecified ptosis of unspecified eyelid: Secondary | ICD-10-CM

## 2013-12-29 DIAGNOSIS — R748 Abnormal levels of other serum enzymes: Secondary | ICD-10-CM

## 2013-12-29 DIAGNOSIS — H02402 Unspecified ptosis of left eyelid: Secondary | ICD-10-CM

## 2013-12-29 DIAGNOSIS — F17200 Nicotine dependence, unspecified, uncomplicated: Secondary | ICD-10-CM | POA: Insufficient documentation

## 2013-12-29 LAB — BASIC METABOLIC PANEL WITH GFR
BUN: 18 mg/dL (ref 6–23)
CO2: 30 mEq/L (ref 19–32)
Calcium: 9.6 mg/dL (ref 8.4–10.5)
Chloride: 105 mEq/L (ref 96–112)
Creat: 0.77 mg/dL (ref 0.50–1.10)
GFR, Est Non African American: >89 mL/min
GLUCOSE: 86 mg/dL (ref 70–99)
Potassium: 4.4 mEq/L (ref 3.5–5.3)
SODIUM: 139 meq/L (ref 135–145)

## 2013-12-29 LAB — CBC WITH DIFFERENTIAL/PLATELET
Basophils Absolute: 0.1 10*3/uL (ref 0.0–0.1)
Basophils Relative: 1 % (ref 0–1)
Eosinophils Absolute: 0.4 10*3/uL (ref 0.0–0.7)
Eosinophils Relative: 5 % (ref 0–5)
HCT: 42.4 % (ref 36.0–46.0)
Hemoglobin: 14.1 g/dL (ref 12.0–15.0)
Lymphocytes Relative: 44 % (ref 12–46)
Lymphs Abs: 3.9 10*3/uL (ref 0.7–4.0)
MCH: 29.3 pg (ref 26.0–34.0)
MCHC: 33.3 g/dL (ref 30.0–36.0)
MCV: 88 fL (ref 78.0–100.0)
Monocytes Absolute: 0.9 10*3/uL (ref 0.1–1.0)
Monocytes Relative: 10 % (ref 3–12)
NEUTROS ABS: 3.7 10*3/uL (ref 1.7–7.7)
Neutrophils Relative %: 40 % — ABNORMAL LOW (ref 43–77)
Platelets: 288 10*3/uL (ref 150–400)
RBC: 4.82 MIL/uL (ref 3.87–5.11)
RDW: 13.2 % (ref 11.5–15.5)
WBC: 8.9 10*3/uL (ref 4.0–10.5)

## 2013-12-29 LAB — HEPATIC FUNCTION PANEL
ALT: 30 U/L (ref 0–35)
AST: 24 U/L (ref 0–37)
Albumin: 4 g/dL (ref 3.5–5.2)
Alkaline Phosphatase: 68 U/L (ref 39–117)
BILIRUBIN TOTAL: 0.4 mg/dL (ref 0.3–1.2)
Bilirubin, Direct: 0.1 mg/dL (ref 0.0–0.3)
Indirect Bilirubin: 0.3 mg/dL (ref 0.0–0.9)
Total Protein: 6.4 g/dL (ref 6.0–8.3)

## 2013-12-29 LAB — TSH: TSH: 0.071 u[IU]/mL — ABNORMAL LOW (ref 0.350–4.500)

## 2013-12-29 NOTE — Patient Instructions (Signed)

## 2013-12-29 NOTE — Progress Notes (Signed)
HPI Patient presents for a one month follow up.  Patient last visit was treated for bell's palsy, she states her eye is better however she continues to have ptosis when she is tired and gets fatigued as she does things through the day. She has stated that she has had double vision as well. She also continues to have abnormal menses with dysparunia and sees her OB/GYN on Friday. After several labs  Lab Results  Component Value Date   ALT 83* 11/18/2013   AST 44* 11/18/2013   ALKPHOS 59 11/18/2013   BILITOT 0.4 11/18/2013   She had a moderately elevated mitochondrial antibody and has an appointment with GI 01/01/2014.  She had a negative ANA, ESR.  Lab Results  Component Value Date   TSH 0.285* 11/18/2013   She is on 80mg daily except she is on 1/2 Saturday and Sunday.  She continues to have double vision, headache, left eye ptosis, leg weakness with stairs without numbness and tingling in her legs but some in her hands after sleeping, she has had bowel incontinence once and states she feels like she can not completely void her bladder.   Past Medical History  Diagnosis Date  . Thyroid disease   . Hyperlipidemia   . Abdominal pain   . Endometriosis   . Vitamin D deficiency      No Known Allergies    Current Outpatient Prescriptions on File Prior to Visit  Medication Sig Dispense Refill  . levothyroxine (SYNTHROID, LEVOTHROID) 75 MCG tablet Take 150 mcg by mouth daily.       . rosuvastatin (CRESTOR) 10 MG tablet Take 10 mg by mouth daily.       No current facility-administered medications on file prior to visit.    ROS: all negative expect above.   Physical: Filed Weights   12/29/13 0849  Weight: 198 lb (89.812 kg)   Filed Vitals:   12/29/13 0849  BP: 110/70  Pulse: 76  Temp: 98.7 F (37.1 C)  Resp: 16   General Appearance: Well nourished, in no apparent distress. Eyes: PERRLA, EOMs, left eye ptosis Sinuses: No Frontal/maxillary tenderness ENT/Mouth: Ext aud canals  clear, normal light reflex with TMs without erythema, bulging. Post pharynx without erythema, swelling, exudate.  Respiratory: RLL wheezing Cardio: RRR, no murmurs, rubs or gallops. Peripheral pulses brisk and equal bilaterally, without edema. No aortic or femoral bruits. Abdomen: Soft, with bowl sounds. RUQ tender, no guarding, rebound. Lymphatics: Non tender without lymphadenopathy.  Musculoskeletal: Full ROM all peripheral extremities, 5/5 strength, and normal gait. Skin: Warm, dry without rashes, lesions, ecchymosis.  Neuro: Cranial nerves intact, reflexes equal bilaterally. Normal muscle tone, no cerebellar symptoms. Sensation intact.  Pysch: Awake and oriented X 3, normal affect, Insight and Judgment appropriate.   Assessment and Plan: 1) left eye ptosis, headache, double vision- get CT head/neck rule out mass, see eye doctor.  2) wheezing right lower lung- get CXR, smoking cessation 3) Elevated LFTs- repeat labs, will not get imaging at this time since she will be seeing Dr. PHilarie Fredricksonshortly.  4) Abnormal menses with dysparunia- f/u GI

## 2013-12-30 NOTE — Addendum Note (Signed)
Addended by: Vicie Mutters R on: 12/30/2013 10:07 AM   Modules accepted: Orders

## 2013-12-31 ENCOUNTER — Ambulatory Visit
Admission: RE | Admit: 2013-12-31 | Discharge: 2013-12-31 | Disposition: A | Discharge status: Home / Self Care | Payer: BC Managed Care – PPO | Source: Ambulatory Visit | Attending: Physician Assistant | Admitting: Physician Assistant

## 2013-12-31 ENCOUNTER — Encounter: Payer: Self-pay | Admitting: Internal Medicine

## 2013-12-31 DIAGNOSIS — H02402 Unspecified ptosis of left eyelid: Secondary | ICD-10-CM

## 2013-12-31 DIAGNOSIS — R490 Dysphonia: Secondary | ICD-10-CM

## 2013-12-31 DIAGNOSIS — H532 Diplopia: Secondary | ICD-10-CM

## 2013-12-31 MED ORDER — IOHEXOL 300 MG/ML  SOLN
75.0000 mL | Freq: Once | INTRAMUSCULAR | Status: AC | PRN
  Administered 2013-12-31: 75 mL via INTRAVENOUS

## 2013-12-31 MED ORDER — IOHEXOL 300 MG/ML  SOLN: 75.0000 mL | Freq: Once | INTRAMUSCULAR | Status: DC | PRN

## 2014-01-01 ENCOUNTER — Other Ambulatory Visit: Payer: Self-pay | Admitting: Gastroenterology

## 2014-01-01 ENCOUNTER — Other Ambulatory Visit (INDEPENDENT_AMBULATORY_CARE_PROVIDER_SITE_OTHER): Payer: BC Managed Care – PPO

## 2014-01-01 ENCOUNTER — Telehealth: Payer: Self-pay | Admitting: Gastroenterology

## 2014-01-01 ENCOUNTER — Ambulatory Visit (INDEPENDENT_AMBULATORY_CARE_PROVIDER_SITE_OTHER): Payer: BC Managed Care – PPO | Admitting: Internal Medicine

## 2014-01-01 ENCOUNTER — Encounter: Payer: Self-pay | Admitting: Internal Medicine

## 2014-01-01 VITALS — BP 110/78 | HR 82 | Ht 68.0 in | Wt 192.0 lb

## 2014-01-01 DIAGNOSIS — R945 Abnormal results of liver function studies: Secondary | ICD-10-CM

## 2014-01-01 DIAGNOSIS — R7989 Other specified abnormal findings of blood chemistry: Secondary | ICD-10-CM

## 2014-01-01 DIAGNOSIS — R109 Unspecified abdominal pain: Secondary | ICD-10-CM

## 2014-01-01 DIAGNOSIS — E079 Disorder of thyroid, unspecified: Secondary | ICD-10-CM

## 2014-01-01 DIAGNOSIS — R197 Diarrhea, unspecified: Secondary | ICD-10-CM

## 2014-01-01 DIAGNOSIS — N809 Endometriosis, unspecified: Secondary | ICD-10-CM | POA: Insufficient documentation

## 2014-01-01 DIAGNOSIS — R1030 Lower abdominal pain, unspecified: Secondary | ICD-10-CM

## 2014-01-01 LAB — IGA: IgA: 154 mg/dL (ref 68–378)

## 2014-01-01 MED ORDER — DIPHENOXYLATE-ATROPINE 2.5-0.025 MG PO TABS: 1.0000 | ORAL_TABLET | Freq: Four times a day (QID) | ORAL | Status: DC | PRN

## 2014-01-01 MED ORDER — HYOSCYAMINE SULFATE 0.125 MG SL SUBL: 0.1250 mg | SUBLINGUAL_TABLET | SUBLINGUAL | Status: DC | PRN

## 2014-01-01 NOTE — Telephone Encounter (Signed)
lvm for pt telling her U/S appointment info and telling her I will mail her out appiontment info. 01/07/2014 @ 7:30am

## 2014-01-01 NOTE — Patient Instructions (Signed)
Your physician has requested that you go to the basement for the following lab work before leaving today: Celiac panel.  In 3 moths (early April) please stop down to our lab and have repeat CBC, CMP, INR labs done  We have sent the following medications to your pharmacy for you to pick up at your convenience: Levsin, Lomotil, please take as directed.    Start taking benefiber 1-2 tablespoons daily   Discuss Endometriosis with your Gynocologist.  Follow up in office with Dr. Hilarie Fredrickson in 6 weeks                                               We are excited to introduce MyChart, a new best-in-class service that provides you online access to important information in your electronic medical record. We want to make it easier for you to view your health information - all in one secure location - when and where you need it. We expect MyChart will enhance the quality of care and service we provide.  When you register for MyChart, you can:    View your test results.    Request appointments and receive appointment reminders via email.    Request medication renewals.    View your medical history, allergies, medications and immunizations.    Communicate with your physician's office through a password-protected site.    Conveniently print information such as your medication lists.  To find out if MyChart is right for you, please talk to a member of our clinical staff today. We will gladly answer your questions about this free health and wellness tool.  If you are age 24 or older and want a member of your family to have access to your record, you must provide written consent by completing a proxy form available at our office. Please speak to our clinical staff about guidelines regarding accounts for patients younger than age 99.  As you activate your MyChart account and need any technical assistance, please call the MyChart technical support line at [phone]) or email your question to  [email or web address]. If you email your question(s), please include your name, a return phone number and the best time to reach you.  If you have non-urgent health-related questions, you can send a message to our office through [place] at [email or web address]. If you have a medical emergency, call 911.  Thank you for using MyChart as your new health and wellness resource!   MyChart licensed from [employer],  1999-2010. Patents Pending.

## 2014-01-01 NOTE — Progress Notes (Signed)
Patient ID: Tammy Gibson, female   DOB: 1976/07/28, 38 y.o.   MRN: [id number] HPI: Tammy Gibson is a 38 yo female with PMH of hypothyroidism, hyperlipidemia, endometriosis and vitamin D deficiency who is seen in consultation at the request of Dr. Melford Aase to evaluate elevated LFTs and lower abdominal pain with loose stools. She is here alone today. She reports that recently on general labs performed by her primary care her liver enzymes are elevated. This was a new finding for her and she denies prior history of abnormal liver test. No jaundice, itching, bleeding, or edema/ascites. Her maternal grandfather may have had liver disease but of uncertain type. Her paternal grandfather had colon cancer at an advanced age. She denies alcohol use. No over-the-counter medications. No right upper quadrant or epigastric pain. Good appetite, no nausea or vomiting. No heartburn. No trouble swallowing.  Separate from this she reports episodic loose and urgent stools associated with bilateral, right greater than left, lower abdominal cramping pain. This tends to occur in the days preceding and following her menstrual cycle. She reports this is somewhat unpredictable and has been happening about 2 years. She tried gluten-free diet for one month with no benefit. She's also tried to identify trigger foods without obvious trigger. Occasionally the cramping can become so severe that she develops nausea and diaphoresis. Again no nausea outside of these episodes. She denies blood in her stool or melena, but reports she did see blood in her stool on one occasion in October or November 2014. She visited the ER after this and was told it was hemorrhoids.  She reports kidney stones in November.  She has a GYN appointment next week.  Of note her Synthroid dose has been changed recently, approximately 3 weeks ago for a TSH which was low.  Past Medical History  Diagnosis Date  . Thyroid disease   . Hyperlipidemia   . Abdominal pain    . Endometriosis   . Vitamin D deficiency     Past Surgical History  Procedure Laterality Date  . Ectopic pregnancy surgery  1997    RIGHT TUBE    Current Outpatient Prescriptions  Medication Sig Dispense Refill  . Cholecalciferol (VITAMIN D3) 10000 UNITS capsule Take 10,000 Units by mouth daily.      Marland Kitchen levothyroxine (SYNTHROID, LEVOTHROID) 75 MCG tablet Take 150 mcg by mouth daily.       . rosuvastatin (CRESTOR) 10 MG tablet Take 10 mg by mouth daily.      . diphenoxylate-atropine (LOMOTIL) 2.5-0.025 MG per tablet Take 1 tablet by mouth 4 (four) times daily as needed for diarrhea or loose stools.  120 tablet  1  . hyoscyamine (LEVSIN SL) 0.125 MG SL tablet Place 1 tablet (0.125 mg total) under the tongue every 4 (four) hours as needed.  30 tablet  2   No current facility-administered medications for this visit.    No Known Allergies  Family History  Problem Relation Age of Onset  . Hypertension Father   . Hyperlipidemia Father   . Diabetes Father   . Thyroid disease Father   . Thyroid disease Sister   . Thyroid disease Mother   . Cancer Maternal Aunt     BREAST/ COLON/ PROSTATE IN GRANDPARENTS  . Cancer - Colon Paternal Grandfather     History  Substance Use Topics  . Smoking status: Current Some Day Smoker -- 0.00 packs/day    Types: Cigarettes  . Smokeless tobacco: Never Used     Comment: in the  process of quitting. 1 cig q 2-3 days  . Alcohol Use: Yes     Comment: SOCIAL    ROS: As per history of present illness, otherwise negative  BP 110/78  Pulse 82  Ht _0  (1.727 m)  Wt 192 lb (87.091 kg)  BMI 29.20 kg/m2  LMP 12/25/2013 Constitutional: Well-developed and well-nourished. No distress. HEENT: Normocephalic and atraumatic. Oropharynx is clear and moist. No oropharyngeal exudate. Conjunctivae are normal.  No scleral icterus. Neck: Neck supple. Trachea midline. Cardiovascular: Normal rate, regular rhythm and intact distal pulses. No  M/R/G Pulmonary/chest: Effort normal and breath sounds normal. No wheezing, rales or rhonchi. Abdominal: Soft, nontender, nondistended. Bowel sounds active throughout.  Extremities: no clubbing, cyanosis, or edema Lymphadenopathy: No cervical adenopathy noted. Neurological: Alert and oriented to person place and time. Skin: Skin is warm and dry. No rashes noted. Psychiatric: Normal mood and affect. Behavior is normal.  RELEVANT LABS AND IMAGING: CBC    Component Value Date/Time   WBC 8.9 12/29/2013 0920   RBC 4.82 12/29/2013 0920   HGB 14.1 12/29/2013 0920   HCT 42.4 12/29/2013 0920   PLT 288 12/29/2013 0920   MCV 88.0 12/29/2013 0920   MCH 29.3 12/29/2013 0920   MCHC 33.3 12/29/2013 0920   RDW 13.2 12/29/2013 0920   LYMPHSABS 3.9 12/29/2013 0920   MONOABS 0.9 12/29/2013 0920   EOSABS 0.4 12/29/2013 0920   BASOSABS 0.1 12/29/2013 0920    CMP     Component Value Date/Time   NA 139 12/29/2013 0920   K 4.4 12/29/2013 0920   CL 105 12/29/2013 0920   CO2 30 12/29/2013 0920   GLUCOSE 86 12/29/2013 0920   BUN 18 12/29/2013 0920   CREATININE 0.77 12/29/2013 0920   CREATININE 0.71 06/27/2011 2222   CALCIUM 9.6 12/29/2013 0920   PROT 6.4 12/29/2013 0920   ALBUMIN 4.0 12/29/2013 0920   AST 24 12/29/2013 0920   ALT 30 12/29/2013 0920   ALKPHOS 68 12/29/2013 0920   BILITOT 0.4 12/29/2013 0920   GFRNONAA >60 06/27/2011 2222   GFRAA >60 06/27/2011 2222   Normal RUQ [place] and H[place] - 2012, reports reviewed. Some discomfort with CCK.  Pt has no had cholecystectomy  Results for Tammy, Gibson (MRN [id number]) as of 01/01/2014 13:25  Ref. 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 Melford Aase to evaluate elevated LFTs and lower abd pain with loose stools.  1.  Elevated liver enzymes -- transient elevation in AST and ALT on 11/18/2013 which had resolved by 12/29/2013.  We discussed possible etiologies and her positive anti-smooth muscle antibody. Despite this positive antibody, my suspicion for autoimmune hepatitis remains low. I do recommend abdominal ultrasound to reevaluate hepatic architecture. I have advised that she avoid over-the-counter supplements for now. I would like to follow her liver enzymes every 3 months for the next 12 months to ensure they stay normal. We will also check a  celiac panel. It is possible that her transient elevation was related to mild hyperthyroidism caused by slightly too high dose of Synthroid.  This has been recently adjusted and her TSH will be followed by primary care. I will see her back in 6 weeks  2.  lower abdominal cramping with loose stools -- Sunday symptoms sound terrible in nature, but given the association with her menstrual cycle, endometriosis could be causing some of these issues. I have asked that she discuss this further with her gynecologist next week. I will add Benefiber 1 tablespoon once to twice daily to help bulk her stools and promote regularity. We'll give her a prescription for sublingual Levsin to be used every 6 hours as needed. I have asked that she take Levsin on a scheduled basis on days when she is having the episodes of loose stools. I will also give her Lomotil which she can use when necessary as directed. Celiac panel as above.

## 2014-01-04 LAB — TISSUE TRANSGLUTAMINASE, IGA: TISSUE TRANSGLUTAMINASE AB, IGA: 2.9 U/mL (ref <20)

## 2014-01-07 ENCOUNTER — Ambulatory Visit (HOSPITAL_COMMUNITY)
Admission: RE | Admit: 2014-01-07 | Discharge: 2014-01-07 | Disposition: A | Discharge status: Home / Self Care | Payer: BC Managed Care – PPO | Source: Ambulatory Visit | Attending: Internal Medicine | Admitting: Internal Medicine

## 2014-01-07 DIAGNOSIS — R945 Abnormal results of liver function studies: Secondary | ICD-10-CM

## 2014-01-07 DIAGNOSIS — R7989 Other specified abnormal findings of blood chemistry: Secondary | ICD-10-CM

## 2014-01-11 ENCOUNTER — Encounter: Payer: Self-pay | Admitting: Certified Nurse Midwife

## 2014-01-11 ENCOUNTER — Ambulatory Visit (INDEPENDENT_AMBULATORY_CARE_PROVIDER_SITE_OTHER): Payer: BC Managed Care – PPO | Admitting: Certified Nurse Midwife

## 2014-01-11 VITALS — BP 110/72 | HR 78 | Resp 20 | Ht 67.25 in | Wt 196.0 lb

## 2014-01-11 DIAGNOSIS — R350 Frequency of micturition: Secondary | ICD-10-CM

## 2014-01-11 DIAGNOSIS — Z0000 Encounter for general adult medical examination without abnormal findings: Secondary | ICD-10-CM

## 2014-01-11 DIAGNOSIS — IMO0002 Reserved for concepts with insufficient information to code with codable children: Secondary | ICD-10-CM

## 2014-01-11 DIAGNOSIS — B373 Candidiasis of vulva and vagina: Secondary | ICD-10-CM

## 2014-01-11 DIAGNOSIS — Z01419 Encounter for gynecological examination (general) (routine) without abnormal findings: Secondary | ICD-10-CM

## 2014-01-11 DIAGNOSIS — B3731 Acute candidiasis of vulva and vagina: Secondary | ICD-10-CM

## 2014-01-11 LAB — POCT URINALYSIS DIPSTICK
Bilirubin, UA: NEGATIVE
Blood, UA: NEGATIVE
Glucose, UA: NEGATIVE
Ketones, UA: NEGATIVE
Leukocytes, UA: NEGATIVE
Nitrite, UA: NEGATIVE
Protein, UA: NEGATIVE
Urobilinogen, UA: NEGATIVE
pH, UA: 5

## 2014-01-11 MED ORDER — TERCONAZOLE 0.4 % VA CREA: 1.0000 | TOPICAL_CREAM | Freq: Every day | VAGINAL | Status: DC

## 2014-01-11 NOTE — Patient Instructions (Addendum)
EXERCISE AND DIET:  We recommended that you start or continue a regular exercise program for good health. Regular exercise means any activity that makes your heart beat faster and makes you sweat.  We recommend exercising at least 30 minutes per day at least 3 days a week, preferably 4 or 5.  We also recommend a diet low in fat and sugar.  Inactivity, poor dietary choices and obesity can cause diabetes, heart attack, stroke, and kidney damage, among others.    ALCOHOL AND SMOKING:  Women should limit their alcohol intake to no more than 7 drinks/beers/glasses of wine (combined, not each!) per week. Moderation of alcohol intake to this level decreases your risk of breast cancer and liver damage. And of course, no recreational drugs are part of a healthy lifestyle.  And absolutely no smoking or even second hand smoke. Most people know smoking can cause heart and lung diseases, but did you know it also contributes to weakening of your bones? Aging of your skin?  Yellowing of your teeth and nails?  CALCIUM AND VITAMIN D:  Adequate intake of calcium and Vitamin D are recommended.  The recommendations for exact amounts of these supplements seem to change often, but generally speaking 600 mg of calcium (either carbonate or citrate) and 800 units of Vitamin D per day seems prudent. Certain women may benefit from higher intake of Vitamin D.  If you are among these women, your doctor will have told you during your visit.    PAP SMEARS:  Pap smears, to check for cervical cancer or precancers,  have traditionally been done yearly, although recent scientific advances have shown that most women can have pap smears less often.  However, every woman still should have a physical exam from her gynecologist every year. It will include a breast check, inspection of the vulva and vagina to check for abnormal growths or skin changes, a visual exam of the cervix, and then an exam to evaluate the size and shape of the uterus and  ovaries.  And after 40 years of age, a rectal exam is indicated to check for rectal cancers. We will also provide age appropriate advice regarding health maintenance, like when you should have certain vaccines, screening for sexually transmitted diseases, bone density testing, colonoscopy, mammograms, etc.   MAMMOGRAMS:  All women over 40 years old should have a yearly mammogram. Many facilities now offer a "3D" mammogram, which may cost around $50 extra out of pocket. If possible,  we recommend you accept the option to have the 3D mammogram performed.  It both reduces the number of women who will be called back for extra views which then turn out to be normal, and it is better than the routine mammogram at detecting truly abnormal areas.    COLONOSCOPY:  Colonoscopy to screen for colon cancer is recommended for all women at age 50.  We know, you hate the idea of the prep.  We agree, BUT, having colon cancer and not knowing it is worse!!  Colon cancer so often starts as a polyp that can be seen and removed at colonscopy, which can quite literally save your life!  And if your first colonoscopy is normal and you have no family history of colon cancer, most women don't have to have it again for 10 years.  Once every ten years, you can do something that may end up saving your life, right?  We will be happy to help you get it scheduled when you are ready.    Be sure to check your insurance coverage so you understand how much it will cost.  It may be covered as a preventative service at no cost, but you should check your particular policy.     Yeast Infection of the Skin Some yeast on the skin is normal, but sometimes it causes an infection. If you have a yeast infection, it shows up as white or light brown patches on brown skin. You can see it better in the summer on tan skin. It causes light-colored holes in your suntan. It can happen on any area of the body. This cannot be passed from person to person. HOME  CARE  Scrub your skin daily with a dandruff shampoo. Your rash may take a couple weeks to get well.  Do not scratch or itch the rash. GET HELP RIGHT AWAY IF:   You get another infection from scratching. The skin may get warm, red, and may ooze fluid.  The infection does not seem to be getting better. MAKE SURE YOU:  Understand these instructions.  Will watch your condition.  Will get help right away if you are not doing well or get worse. Document Released: 11/15/2008 Document Revised: 02/25/2012 Document Reviewed: 11/15/2008 Exit[employer] Patient Information 2014 [employer].  

## 2014-01-11 NOTE — Progress Notes (Signed)
38 y.o. . Married Caucasian Fe g2 p0020 here to establish gyn care and for annual exam. 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 Previous IUD, had slightly better cycle control, but pain with IUD. Periods have always been heavy, cramping with for the entire duration, usually 6 to 8 days. Was diagnosed with endometriosis by [place] only. Patient has tried OCP, but no Depo Provera use. Patient might consider ablation, unsure. Patient also describes vaginal bleeding or spotting with intercourse and pain with deep penetration only. No STD concerns or testing desired.  [facility] for medication management for Hypothyroid, cholesterol  Management. Sees GI  For IBS medication management. No medication changes. Complaining of increase vaginal discharge with itching, no burning or odor. No new personal products.   Patient's last menstrual period was 12/29/2013.          Sexually active: yes  The current method of family planning is none.    Exercising: no  exercise Smoker:  yes  Health Maintenance: Pap:  Fall of 2013, no history of abnormal paps MMG:  none Colonoscopy:  none BMD:   none TDaP:  2010 Labs: Poct urine-neg Self breast exam: done monthly   reports that she has been smoking Cigarettes.  She has been smoking about 0.00 packs per day. She has never used smokeless tobacco. She reports that she drinks alcohol. She reports that she does not use illicit drugs.  Past Medical History  Diagnosis Date  . Hyperlipidemia   . Abdominal pain   . Endometriosis   . Vitamin D deficiency   . Thyroid disease     pt was hypo now hyperthyroid  . Bell's palsy   . Adenomyosis   . Dysmenorrhea   . Dyspareunia   . Infertility, female   . Urinary incontinence     Past Surgical History  Procedure Laterality Date  . Ectopic pregnancy surgery  1997    RIGHT TUBE  . Salpingectomy Right   . Iud removal  2013    mirena,only in for 6 months     Current Outpatient Prescriptions  Medication Sig Dispense Refill  . Cholecalciferol (VITAMIN D3) 10000 UNITS capsule Take 10,000 Units by mouth daily.      . diphenoxylate-atropine (LOMOTIL) 2.5-0.025 MG per tablet Take 1 tablet by mouth 4 (four) times daily as needed for diarrhea or loose stools.  120 tablet  1  . hyoscyamine (LEVSIN SL) 0.125 MG SL tablet Place 1 tablet (0.125 mg total) under the tongue every 4 (four) hours as needed.  30 tablet  2  . levothyroxine (SYNTHROID, LEVOTHROID) 75 MCG tablet Take 150 mcg by mouth daily.       . rosuvastatin (CRESTOR) 10 MG tablet Take 10 mg by mouth daily.       No current facility-administered medications for this visit.    Family History  Problem Relation Age of Onset  . Hypertension Father   . Hyperlipidemia Father   . Diabetes Father   . Thyroid disease Father   . Thyroid disease Sister   . Thyroid disease Mother   . Breast cancer Maternal Aunt   . Cancer - Colon Paternal Grandfather   . Thyroid disease Paternal Grandmother     ROS:  Pertinent items are noted in HPI.  Otherwise, a comprehensive ROS was negative.  Exam:   BP 110/72  Pulse 78  Resp 20  Ht 5' 7.25" (1.708 m)  Wt 196 lb (88.905 kg)  BMI 30.48 kg/m2  LMP 12/29/2013 Height: 5' 7.25" (170.8 cm)  Ht Readings from Last 3 Encounters:  01/11/14 5' 7.25" (1.708 m)  01/01/14 5\' 8"  (1.727 m)  12/29/13 5\' 8"  (1.727 m)    General appearance: alert, cooperative and appears stated age Head: Normocephalic, without obvious abnormality, atraumatic Neck: no adenopathy, supple, symmetrical, trachea midline and thyroid normal to inspection and palpation Lungs: clear to auscultation bilaterally Breasts: normal appearance, no masses or tenderness, No nipple retraction or dimpling, No nipple discharge or bleeding, No axillary or supraclavicular adenopathy Heart: regular rate and rhythm Abdomen: soft, non-tender; no masses,  no organomegaly Extremities: extremities normal,  atraumatic, no cyanosis or edema Skin: Skin color, texture, turgor normal. No rashes or lesions Lymph nodes: Cervical, supraclavicular, and axillary nodes normal. No abnormal inguinal nodes palpated Neurologic: Grossly normal   Pelvic: External genitalia:  no lesions              Urethra:  normal appearing urethra with no masses, tenderness or lesions              Bartholin's and Skene's: normal                 Vagina:white, thick discharge noted, no odor, ph 4.0, wet prep taken appearing vagina with normal color and discharge, no lesions              Cervix: non tender, normal appearance              Pap taken: yes Bimanual Exam:  Uterus:  normal size, contour, position, consistency, mobility, non-tender and mid position              Adnexa: normal adnexa and no mass, fullness, tenderness               Rectovaginal: Confirms               Anus:  normal sphincter tone, no lesions  Wet prep: Positive for yeast only  A:  Well Woman with normal exam  Contraception none her preference,history of infertility  Yeast vaginitis  Dysmenorrhea with menorrhagia, may desire cycle control trial again, Recent Hgb with PCP ( viewed on patient phone chart access 14)  History of endometriosis per [place] only  History of IBS with GI management  History of Hypothyroid/elevated cholesterol with PCP management  P:   Reviewed health and wellness pertinent to exam  Discussed findings of yeast, maybe cause of spotting with intercourse, but may need further evaluation with PUS. Patient agreeable if needed.  Rx Terazol 7 see order  Discussed usually diagnosed by surgical exam. Patient did have ruptured ectopic, and did have exam at that time. Patient will advise if desires pursuing management of bleeding.   Continue follow up as indicated  Pap smear as per guidelines   Mammogram at 39 pap smear taken today with [facility]  counseled on breast self exam, adequate intake of calcium and vitamin D, diet and  exercise  return annually or prn  An After Visit Summary was printed and given to the patient.

## 2014-01-12 LAB — URINE CULTURE
Colony Count: NO GROWTH
Organism ID, Bacteria: NO GROWTH

## 2014-01-13 LAB — IPS PAP TEST WITH HPV

## 2014-01-13 NOTE — Progress Notes (Signed)
Reviewed personally.  M. Suzanne Jeniffer Culliver, MD.  

## 2014-01-16 ENCOUNTER — Encounter: Payer: Self-pay | Admitting: Certified Nurse Midwife

## 2014-02-12 ENCOUNTER — Encounter: Payer: Self-pay | Admitting: Internal Medicine

## 2014-02-16 ENCOUNTER — Ambulatory Visit: Payer: BC Managed Care – PPO | Admitting: Internal Medicine

## 2014-02-17 ENCOUNTER — Encounter: Payer: Self-pay | Admitting: Internal Medicine

## 2014-02-17 DIAGNOSIS — Z79899 Other long term (current) drug therapy: Secondary | ICD-10-CM | POA: Insufficient documentation

## 2014-02-17 DIAGNOSIS — I10 Essential (primary) hypertension: Secondary | ICD-10-CM | POA: Insufficient documentation

## 2014-02-17 DIAGNOSIS — Z8639 Personal history of other endocrine, nutritional and metabolic disease: Secondary | ICD-10-CM | POA: Insufficient documentation

## 2014-02-17 DIAGNOSIS — Z0000 Encounter for general adult medical examination without abnormal findings: Secondary | ICD-10-CM

## 2014-02-17 NOTE — Progress Notes (Signed)
error 

## 2014-02-18 ENCOUNTER — Encounter: Payer: Self-pay | Admitting: Internal Medicine

## 2014-08-09 ENCOUNTER — Encounter: Payer: Self-pay | Admitting: Physician Assistant

## 2014-08-17 ENCOUNTER — Encounter: Payer: Self-pay | Admitting: Internal Medicine

## 2014-08-17 ENCOUNTER — Ambulatory Visit (INDEPENDENT_AMBULATORY_CARE_PROVIDER_SITE_OTHER): Payer: BC Managed Care – PPO | Admitting: Internal Medicine

## 2014-08-17 VITALS — BP 118/84 | HR 76 | Temp 98.8°F | Resp 16 | Ht 68.0 in | Wt 196.2 lb

## 2014-08-17 DIAGNOSIS — I10 Essential (primary) hypertension: Secondary | ICD-10-CM

## 2014-08-17 DIAGNOSIS — R740 Nonspecific elevation of levels of transaminase and lactic acid dehydrogenase [LDH]: Secondary | ICD-10-CM

## 2014-08-17 DIAGNOSIS — E559 Vitamin D deficiency, unspecified: Secondary | ICD-10-CM

## 2014-08-17 DIAGNOSIS — Z111 Encounter for screening for respiratory tuberculosis: Secondary | ICD-10-CM

## 2014-08-17 DIAGNOSIS — Z0000 Encounter for general adult medical examination without abnormal findings: Secondary | ICD-10-CM

## 2014-08-17 DIAGNOSIS — E039 Hypothyroidism, unspecified: Secondary | ICD-10-CM

## 2014-08-17 DIAGNOSIS — E782 Mixed hyperlipidemia: Secondary | ICD-10-CM

## 2014-08-17 DIAGNOSIS — Z113 Encounter for screening for infections with a predominantly sexual mode of transmission: Secondary | ICD-10-CM

## 2014-08-17 DIAGNOSIS — R7401 Elevation of levels of liver transaminase levels: Secondary | ICD-10-CM

## 2014-08-17 DIAGNOSIS — R7402 Elevation of levels of lactic acid dehydrogenase (LDH): Secondary | ICD-10-CM

## 2014-08-17 DIAGNOSIS — Z1212 Encounter for screening for malignant neoplasm of rectum: Secondary | ICD-10-CM

## 2014-08-17 DIAGNOSIS — Z79899 Other long term (current) drug therapy: Secondary | ICD-10-CM

## 2014-08-17 LAB — CBC WITH DIFFERENTIAL/PLATELET
BASOS PCT: 1 % (ref 0–1)
Basophils Absolute: 0.1 10*3/uL (ref 0.0–0.1)
EOS ABS: 0.3 10*3/uL (ref 0.0–0.7)
Eosinophils Relative: 3 % (ref 0–5)
HCT: 43.1 % (ref 36.0–46.0)
Hemoglobin: 14.6 g/dL (ref 12.0–15.0)
Lymphocytes Relative: 27 % (ref 12–46)
Lymphs Abs: 2.4 10*3/uL (ref 0.7–4.0)
MCH: 29.9 pg (ref 26.0–34.0)
MCHC: 33.9 g/dL (ref 30.0–36.0)
MCV: 88.1 fL (ref 78.0–100.0)
Monocytes Absolute: 0.7 10*3/uL (ref 0.1–1.0)
Monocytes Relative: 8 % (ref 3–12)
NEUTROS PCT: 61 % (ref 43–77)
Neutro Abs: 5.4 10*3/uL (ref 1.7–7.7)
Platelets: 294 10*3/uL (ref 150–400)
RBC: 4.89 MIL/uL (ref 3.87–5.11)
RDW: 13.8 % (ref 11.5–15.5)
WBC: 8.9 10*3/uL (ref 4.0–10.5)

## 2014-08-17 LAB — HEMOGLOBIN A1C
HEMOGLOBIN A1C: 5.8 % — AB (ref <5.7)
MEAN PLASMA GLUCOSE: 120 mg/dL — AB (ref <117)

## 2014-08-17 NOTE — Patient Instructions (Signed)
Recommend the book "The END of DIETING" by Dr Baker Janus   and the book "The END of DIABETES " by Dr Excell Seltzer  At [email or web address] - get book & Audio CD's      Being diabetic has a  300% increased risk for heart attack, stroke, cancer, and alzheimer- type vascular dementia. It is very important that you work harder with diet by avoiding all foods that are white except chicken & fish. Avoid white rice (brown & wild rice is OK), white potatoes (sweetpotatoes in moderation is OK), White bread or wheat bread or anything made out of white flour like bagels, donuts, rolls, buns, biscuits, cakes, pastries, cookies, pizza crust, and pasta (made from white flour & egg whites) - vegetarian pasta or spinach or wheat pasta is OK. Multigrain breads like Arnold's or [employer], or multigrain sandwich thins or flatbreads.  Diet, exercise and weight loss can reverse and cure diabetes in the early stages.  Diet, exercise and weight loss is very important in the control and prevention of complications of diabetes which affects every system in your body, ie. Brain - dementia/stroke, eyes - glaucoma/blindness, heart - heart attack/heart failure, kidneys - dialysis, stomach - gastric paralysis, intestines - malabsorption, nerves - severe painful neuritis, circulation - gangrene & loss of a leg(s), and finally cancer and Alzheimers.    I recommend avoid fried & greasy foods,  sweets/candy, white rice (brown or wild rice or Quinoa is OK), white potatoes (sweet potatoes are OK) - anything made from white flour - bagels, doughnuts, rolls, buns, biscuits,white and wheat breads, pizza crust and traditional pasta made of white flour & egg white(vegetarian pasta or spinach or wheat pasta is OK).  Multi-grain bread is OK - like multi-grain flat bread or sandwich thins. Avoid alcohol in excess. Exercise is also important.    Eat all the vegetables you want - avoid meat, especially red meat and dairy - especially cheese.  Cheese  is the most concentrated form of trans-fats which is the worst thing to clog up our arteries. Veggie cheese is OK which can be found in the fresh produce section at [employer] or [employer] or [employer]  Preventive Care for Adults A healthy lifestyle and preventive care can promote health and wellness. Preventive health guidelines for women include the following key practices.  A routine yearly physical is a good way to check with your health care provider about your health and preventive screening. It is a chance to share any concerns and updates on your health and to receive a thorough exam.  Visit your dentist for a routine exam and preventive care every 6 months. Brush your teeth twice a day and floss once a day. Good oral hygiene prevents tooth decay and gum disease.  The frequency of eye exams is based on your age, health, family medical history, use of contact lenses, and other factors. Follow your health care provider's recommendations for frequency of eye exams.  Eat a healthy diet. Foods like vegetables, fruits, whole grains, low-fat dairy products, and lean protein foods contain the nutrients you need without too many calories. Decrease your intake of foods high in solid fats, added sugars, and salt. Eat the right amount of calories for you.Get information about a proper diet from your health care provider, if necessary.  Regular physical exercise is one of the most important things you can do for your health. Most adults should get at least 150 minutes of moderate-intensity exercise (any activity that increases  your heart rate and causes you to sweat) each week. In addition, most adults need muscle-strengthening exercises on 2 or more days a week.  Maintain a healthy weight. The body mass index (BMI) is a screening tool to identify possible weight problems. It provides an estimate of body fat based on height and weight. Your health care provider can find your BMI and can help you  achieve or maintain a healthy weight.For adults 20 years and older:  A BMI below 18.5 is considered underweight.  A BMI of 18.5 to 24.9 is normal.  A BMI of 25 to 29.9 is considered overweight.  A BMI of 30 and above is considered obese.  Maintain normal blood lipids and cholesterol levels by exercising and minimizing your intake of saturated fat. Eat a balanced diet with plenty of fruit and vegetables. Blood tests for lipids and cholesterol should begin at age 43 and be repeated every 5 years. If your lipid or cholesterol levels are high, you are over 50, or you are at high risk for heart disease, you may need your cholesterol levels checked more frequently.Ongoing high lipid and cholesterol levels should be treated with medicines if diet and exercise are not working.  If you smoke, find out from your health care provider how to quit. If you do not use tobacco, do not start.  Lung cancer screening is recommended for adults aged 40-80 years who are at high risk for developing lung cancer because of a history of smoking. A yearly low-dose CT scan of the lungs is recommended for people who have at least a 30-pack-year history of smoking and are a current smoker or have quit within the past 15 years. A pack year of smoking is smoking an average of 1 pack of cigarettes a day for 1 year (for example: 1 pack a day for 30 years or 2 packs a day for 15 years). Yearly screening should continue until the smoker has stopped smoking for at least 15 years. Yearly screening should be stopped for people who develop a health problem that would prevent them from having lung cancer treatment.  If you are pregnant, do not drink alcohol. If you are breastfeeding, be very cautious about drinking alcohol. If you are not pregnant and choose to drink alcohol, do not have more than 1 drink per day. One drink is considered to be 12 ounces (355 mL) of beer, 5 ounces (148 mL) of wine, or 1.5 ounces (44 mL) of liquor.  Avoid  use of street drugs. Do not share needles with anyone. Ask for help if you need support or instructions about stopping the use of drugs.  High blood pressure causes heart disease and increases the risk of stroke. Your blood pressure should be checked at least every 1 to 2 years. Ongoing high blood pressure should be treated with medicines if weight loss and exercise do not work.  If you are 74-43 years old, ask your health care provider if you should take aspirin to prevent strokes.  Diabetes screening involves taking a blood sample to check your fasting blood sugar level. This should be done once every 3 years, after age 105, if you are within normal weight and without risk factors for diabetes. Testing should be considered at a younger age or be carried out more frequently if you are overweight and have at least 1 risk factor for diabetes.  Breast cancer screening is essential preventive care for women. You should practice "breast self-awareness." This means understanding the  normal appearance and feel of your breasts and may include breast self-examination. Any changes detected, no matter how small, should be reported to a health care provider. Women in their 77s and 30s should have a clinical breast exam (CBE) by a health care provider as part of a regular health exam every 1 to 3 years. After age 52, women should have a CBE every year. Starting at age 59, women should consider having a mammogram (breast X-ray test) every year. Women who have a family history of breast cancer should talk to their health care provider about genetic screening. Women at a high risk of breast cancer should talk to their health care providers about having an MRI and a mammogram every year.  Breast cancer gene (BRCA)-related cancer risk assessment is recommended for women who have family members with BRCA-related cancers. BRCA-related cancers include breast, ovarian, tubal, and peritoneal cancers. Having family members with  these cancers may be associated with an increased risk for harmful changes (mutations) in the breast cancer genes BRCA1 and BRCA2. Results of the assessment will determine the need for genetic counseling and BRCA1 and BRCA2 testing.  Routine pelvic exams to screen for cancer are no longer recommended for nonpregnant women who are considered low risk for cancer of the pelvic organs (ovaries, uterus, and vagina) and who do not have symptoms. Ask your health care provider if a screening pelvic exam is right for you.  If you have had past treatment for cervical cancer or a condition that could lead to cancer, you need Pap tests and screening for cancer for at least 20 years after your treatment. If Pap tests have been discontinued, your risk factors (such as having a new sexual partner) need to be reassessed to determine if screening should be resumed. Some women have medical problems that increase the chance of getting cervical cancer. In these cases, your health care provider may recommend more frequent screening and Pap tests.  The HPV test is an additional test that may be used for cervical cancer screening. The HPV test looks for the virus that can cause the cell changes on the cervix. The cells collected during the Pap test can be tested for HPV. The HPV test could be used to screen women aged 43 years and older, and should be used in women of any age who have unclear Pap test results. After the age of 50, women should have HPV testing at the same frequency as a Pap test.  Colorectal cancer can be detected and often prevented. Most routine colorectal cancer screening begins at the age of 30 years and continues through age 66 years. However, your health care provider may recommend screening at an earlier age if you have risk factors for colon cancer. On a yearly basis, your health care provider may provide home test kits to check for hidden blood in the stool. Use of a small camera at the end of a tube, to  directly examine the colon (sigmoidoscopy or colonoscopy), can detect the earliest forms of colorectal cancer. Talk to your health care provider about this at age 43, when routine screening begins. Direct exam of the colon should be repeated every 5-10 years through age 56 years, unless early forms of pre-cancerous polyps or small growths are found.  People who are at an increased risk for hepatitis B should be screened for this virus. You are considered at high risk for hepatitis B if:  You were born in a country where hepatitis B occurs  often. Talk with your health care provider about which countries are considered high risk.  Your parents were born in a high-risk country and you have not received a shot to protect against hepatitis B (hepatitis B vaccine).  You have HIV or AIDS.  You use needles to inject street drugs.  You live with, or have sex with, someone who has hepatitis B.  You get hemodialysis treatment.  You take certain medicines for conditions like cancer, organ transplantation, and autoimmune conditions.  Hepatitis C blood testing is recommended for all people born from [birth year] through 1966 and any individual with known risks for hepatitis C.  Practice safe sex. Use condoms and avoid high-risk sexual practices to reduce the spread of sexually transmitted infections (STIs). STIs include gonorrhea, chlamydia, syphilis, trichomonas, herpes, HPV, and human immunodeficiency virus (HIV). Herpes, HIV, and HPV are viral illnesses that have no cure. They can result in disability, cancer, and death.  You should be screened for sexually transmitted illnesses (STIs) including gonorrhea and chlamydia if:  You are sexually active and are younger than 24 years.  You are older than 24 years and your health care provider tells you that you are at risk for this type of infection.  Your sexual activity has changed since you were last screened and you are at an increased risk for chlamydia or  gonorrhea. Ask your health care provider if you are at risk.  If you are at risk of being infected with HIV, it is recommended that you take a prescription medicine daily to prevent HIV infection. This is called preexposure prophylaxis (PrEP). You are considered at risk if:  You are a heterosexual woman, are sexually active, and are at increased risk for HIV infection.  You take drugs by injection.  You are sexually active with a partner who has HIV.  Talk with your health care provider about whether you are at high risk of being infected with HIV. If you choose to begin PrEP, you should first be tested for HIV. You should then be tested every 3 months for as long as you are taking PrEP.  Osteoporosis is a disease in which the bones lose minerals and strength with aging. This can result in serious bone fractures or breaks. The risk of osteoporosis can be identified using a bone density scan. Women ages 35 years and over and women at risk for fractures or osteoporosis should discuss screening with their health care providers. Ask your health care provider whether you should take a calcium supplement or vitamin D to reduce the rate of osteoporosis.  Menopause can be associated with physical symptoms and risks. Hormone replacement therapy is available to decrease symptoms and risks. You should talk to your health care provider about whether hormone replacement therapy is right for you.  Use sunscreen. Apply sunscreen liberally and repeatedly throughout the day. You should seek shade when your shadow is shorter than you. Protect yourself by wearing long sleeves, pants, a wide-brimmed hat, and sunglasses year round, whenever you are outdoors.  Once a month, do a whole body skin exam, using a mirror to look at the skin on your back. Tell your health care provider of new moles, moles that have irregular borders, moles that are larger than a pencil eraser, or moles that have changed in shape or  color.  Stay current with required vaccines (immunizations).  Influenza vaccine. All adults should be immunized every year.  Tetanus, diphtheria, and acellular pertussis (Td, Tdap) vaccine. Pregnant women should receive  1 dose of Tdap vaccine during each pregnancy. The dose should be obtained regardless of the length of time since the last dose. Immunization is preferred during the 27th-36th week of gestation. An adult who has not previously received Tdap or who does not know her vaccine status should receive 1 dose of Tdap. This initial dose should be followed by tetanus and diphtheria toxoids (Td) booster doses every 10 years. Adults with an unknown or incomplete history of completing a 3-dose immunization series with Td-containing vaccines should begin or complete a primary immunization series including a Tdap dose. Adults should receive a Td booster every 10 years.  Varicella vaccine. An adult without evidence of immunity to varicella should receive 2 doses or a second dose if she has previously received 1 dose. Pregnant females who do not have evidence of immunity should receive the first dose after pregnancy. This first dose should be obtained before leaving the health care facility. The second dose should be obtained 4-8 weeks after the first dose.  Human papillomavirus (HPV) vaccine. Females aged 13-26 years who have not received the vaccine previously should obtain the 3-dose series. The vaccine is not recommended for use in pregnant females. However, pregnancy testing is not needed before receiving a dose. If a female is found to be pregnant after receiving a dose, no treatment is needed. In that case, the remaining doses should be delayed until after the pregnancy. Immunization is recommended for any person with an immunocompromised condition through the age of 32 years if she did not get any or all doses earlier. During the 3-dose series, the second dose should be obtained 4-8 weeks after the  first dose. The third dose should be obtained 24 weeks after the first dose and 16 weeks after the second dose.  Zoster vaccine. One dose is recommended for adults aged 26 years or older unless certain conditions are present.  Measles, mumps, and rubella (MMR) vaccine. Adults born before [birth year] generally are considered immune to measles and mumps. Adults born in [birth year] or later should have 1 or more doses of MMR vaccine unless there is a contraindication to the vaccine or there is laboratory evidence of immunity to each of the three diseases. A routine second dose of MMR vaccine should be obtained at least 28 days after the first dose for students attending postsecondary schools, health care workers, or international travelers. People who received inactivated measles vaccine or an unknown type of measles vaccine during 1963-1967 should receive 2 doses of MMR vaccine. People who received inactivated mumps vaccine or an unknown type of mumps vaccine before 1979 and are at high risk for mumps infection should consider immunization with 2 doses of MMR vaccine. For females of childbearing age, rubella immunity should be determined. If there is no evidence of immunity, females who are not pregnant should be vaccinated. If there is no evidence of immunity, females who are pregnant should delay immunization until after pregnancy. Unvaccinated health care workers born before [birth year] who lack laboratory evidence of measles, mumps, or rubella immunity or laboratory confirmation of disease should consider measles and mumps immunization with 2 doses of MMR vaccine or rubella immunization with 1 dose of MMR vaccine.  Pneumococcal 13-valent conjugate (PCV13) vaccine. When indicated, a person who is uncertain of her immunization history and has no record of immunization should receive the PCV13 vaccine. An adult aged 47 years or older who has certain medical conditions and has not been previously immunized should receive 1 dose of  PCV13 vaccine. This PCV13 should be followed with a dose of pneumococcal polysaccharide (PPSV23) vaccine. The PPSV23 vaccine dose should be obtained at least 8 weeks after the dose of PCV13 vaccine. An adult aged 66 years or older who has certain medical conditions and previously received 1 or more doses of PPSV23 vaccine should receive 1 dose of PCV13. The PCV13 vaccine dose should be obtained 1 or more years after the last PPSV23 vaccine dose.  Pneumococcal polysaccharide (PPSV23) vaccine. When PCV13 is also indicated, PCV13 should be obtained first. All adults aged 41 years and older should be immunized. An adult younger than age 20 years who has certain medical conditions should be immunized. Any person who resides in a nursing home or long-term care facility should be immunized. An adult smoker should be immunized. People with an immunocompromised condition and certain other conditions should receive both PCV13 and PPSV23 vaccines. People with human immunodeficiency virus (HIV) infection should be immunized as soon as possible after diagnosis. Immunization during chemotherapy or radiation therapy should be avoided. Routine use of PPSV23 vaccine is not recommended for American Indians, [place] Natives, or people younger than 65 years unless there are medical conditions that require PPSV23 vaccine. When indicated, people who have unknown immunization and have no record of immunization should receive PPSV23 vaccine. One-time revaccination 5 years after the first dose of PPSV23 is recommended for people aged 19-64 years who have chronic kidney failure, nephrotic syndrome, asplenia, or immunocompromised conditions. People who received 1-2 doses of PPSV23 before age 33 years should receive another dose of PPSV23 vaccine at age 19 years or later if at least 5 years have passed since the previous dose. Doses of PPSV23 are not needed for people immunized with PPSV23 at or after age 15 years.  Meningococcal vaccine.  Adults with asplenia or persistent complement component deficiencies should receive 2 doses of quadrivalent meningococcal conjugate (MenACWY-D) vaccine. The doses should be obtained at least 2 months apart. Microbiologists working with certain meningococcal bacteria, [place] [place] recruits, people at risk during an outbreak, and people who travel to or live in countries with a high rate of meningitis should be immunized. A first-year college student up through age 52 years who is living in a residence hall should receive a dose if she did not receive a dose on or after her 16th birthday. Adults who have certain high-risk conditions should receive one or more doses of vaccine.  Hepatitis A vaccine. Adults who wish to be protected from this disease, have certain high-risk conditions, work with hepatitis A-infected animals, work in hepatitis A research labs, or travel to or work in countries with a high rate of hepatitis A should be immunized. Adults who were previously unvaccinated and who anticipate close contact with an international adoptee during the first 60 days after arrival in the [place] from a country with a high rate of hepatitis A should be immunized.  Hepatitis B vaccine. Adults who wish to be protected from this disease, have certain high-risk conditions, may be exposed to blood or other infectious body fluids, are household contacts or sex partners of hepatitis B positive people, are clients or workers in certain care facilities, or travel to or work in countries with a high rate of hepatitis B should be immunized.  Haemophilus influenzae type b (Hib) vaccine. A previously unvaccinated person with asplenia or sickle cell disease or having a scheduled splenectomy should receive 1 dose of Hib vaccine. Regardless of previous immunization, a recipient of a hematopoietic stem  cell transplant should receive a 3-dose series 6-12 months after her successful transplant. Hib vaccine is not recommended for  adults with HIV infection. Preventive Services / Frequency Ages 19 to 39 years  Blood pressure check.** / Every 1 to 2 years.  Lipid and cholesterol check.** / Every 5 years beginning at age 20.  Clinical breast exam.** / Every 3 years for women in their 20s and 30s.  BRCA-related cancer risk assessment.** / For women who have family members with a BRCA-related cancer (breast, ovarian, tubal, or peritoneal cancers).  Pap test.** / Every 2 years from ages 21 through 29. Every 3 years starting at age 30 through age 65 or 70 with a history of 3 consecutive normal Pap tests.  HPV screening.** / Every 3 years from ages 30 through ages 65 to 70 with a history of 3 consecutive normal Pap tests.  Hepatitis C blood test.** / For any individual with known risks for hepatitis C.  Skin self-exam. / Monthly.  Influenza vaccine. / Every year.  Tetanus, diphtheria, and acellular pertussis (Tdap, Td) vaccine.** / Consult your health care provider. Pregnant women should receive 1 dose of Tdap vaccine during each pregnancy. 1 dose of Td every 10 years.  Varicella vaccine.** / Consult your health care provider. Pregnant females who do not have evidence of immunity should receive the first dose after pregnancy.  HPV vaccine. / 3 doses over 6 months, if 26 and younger. The vaccine is not recommended for use in pregnant females. However, pregnancy testing is not needed before receiving a dose.  Measles, mumps, rubella (MMR) vaccine.** / You need at least 1 dose of MMR if you were born in 1957 or later. You may also need a 2nd dose. For females of childbearing age, rubella immunity should be determined. If there is no evidence of immunity, females who are not pregnant should be vaccinated. If there is no evidence of immunity, females who are pregnant should delay immunization until after pregnancy.  Pneumococcal 13-valent conjugate (PCV13) vaccine.** / Consult your health care provider.  Pneumococcal  polysaccharide (PPSV23) vaccine.** / 1 to 2 doses if you smoke cigarettes or if you have certain conditions.  Meningococcal vaccine.** / 1 dose if you are age 19 to 21 years and a first-year college student living in a residence hall, or have one of several medical conditions, you need to get vaccinated against meningococcal disease. You may also need additional booster doses.  Hepatitis A vaccine.** / Consult your health care provider.  Hepatitis B vaccine.** / Consult your health care provider.  Haemophilus influenzae type b (Hib) vaccine.** / Consult your health care provider. Ages 40 to 64 years  Blood pressure check.** / Every 1 to 2 years.  Lipid and cholesterol check.** / Every 5 years beginning at age 20 years.  Lung cancer screening. / Every year if you are aged 55-80 years and have a 30-pack-year history of smoking and currently smoke or have quit within the past 15 years. Yearly screening is stopped once you have quit smoking for at least 15 years or develop a health problem that would prevent you from having lung cancer treatment.  Clinical breast exam.** / Every year after age 40 years.  BRCA-related cancer risk assessment.** / For women who have family members with a BRCA-related cancer (breast, ovarian, tubal, or peritoneal cancers).  Mammogram.** / Every year beginning at age 40 years and continuing for as long as you are in good health. Consult with your health care provider.    Pap test.** / Every 3 years starting at age 30 years through age 65 or 70 years with a history of 3 consecutive normal Pap tests.  HPV screening.** / Every 3 years from ages 30 years through ages 65 to 70 years with a history of 3 consecutive normal Pap tests.  Fecal occult blood test (FOBT) of stool. / Every year beginning at age 50 years and continuing until age 75 years. You may not need to do this test if you get a colonoscopy every 10 years.  Flexible sigmoidoscopy or colonoscopy.** / Every 5  years for a flexible sigmoidoscopy or every 10 years for a colonoscopy beginning at age 50 years and continuing until age 75 years.  Hepatitis C blood test.** / For all people born from 1945 through 1965 and any individual with known risks for hepatitis C.  Skin self-exam. / Monthly.  Influenza vaccine. / Every year.  Tetanus, diphtheria, and acellular pertussis (Tdap/Td) vaccine.** / Consult your health care provider. Pregnant women should receive 1 dose of Tdap vaccine during each pregnancy. 1 dose of Td every 10 years.  Varicella vaccine.** / Consult your health care provider. Pregnant females who do not have evidence of immunity should receive the first dose after pregnancy.  Zoster vaccine.** / 1 dose for adults aged 60 years or older.  Measles, mumps, rubella (MMR) vaccine.** / You need at least 1 dose of MMR if you were born in 1957 or later. You may also need a 2nd dose. For females of childbearing age, rubella immunity should be determined. If there is no evidence of immunity, females who are not pregnant should be vaccinated. If there is no evidence of immunity, females who are pregnant should delay immunization until after pregnancy.  Pneumococcal 13-valent conjugate (PCV13) vaccine.** / Consult your health care provider.  Pneumococcal polysaccharide (PPSV23) vaccine.** / 1 to 2 doses if you smoke cigarettes or if you have certain conditions.  Meningococcal vaccine.** / Consult your health care provider.  Hepatitis A vaccine.** / Consult your health care provider.  Hepatitis B vaccine.** / Consult your health care provider.  Haemophilus influenzae type b (Hib) vaccine.** / Consult your health care provider.  

## 2014-08-17 NOTE — Progress Notes (Signed)
Patient ID: Tammy Gibson, female   DOB: 1976/01/31, 38 y.o.   MRN: [id number]  Annual Screening Comprehensive Examination  This very nice 38 y.o.MWF presents for complete physical.  Patient has been followed for elevated BP, Prediabetes, Hyperlipidemia, Hypothyroidism and Vitamin D Deficiency.    Patient did have an elevated BP 12490 in Aug 2013 and has been monitored expectantly since then. Patient denies any cardiac symptoms as chest pain, palpitations, shortness of breath, dizziness or ankle swelling. Today's BP: 118/84 mmHg   Patient's hyperlipidemia is not controlled with diet and medications. Patient denies myalgias or other medication SE's. Last lipids were  Chol 203*; HDL 46; LDL  140*; Trig 85 on 11/18/2013.   Patient has prediabetes and Last A1c was 5.7% in Dec 2014. Patient denies reactive hypoglycemic symptoms, visual blurring, diabetic polys, or paresthesias.   Finally, patient has history of Vitamin D Deficiency (28 in 2014) and last Vitamin D was  67 on 11/18/2013.  Medication Sig  . LOMOTIL)  Take 1 tablet  4 times daily as needed for diarrhea   . hyoscyamine SL 0.125 MGt Place 1 tab under the tongue every 4 hrs as needed.  Marland Kitchen levothyroxine 75 MCG tablet Take 150 mcg  daily.   . rosuvastatin  10 MG  Take 10 mg  daily.  Marland Kitchen terconazole (TERAZOL 7) 0.4 % vagcrm One app full QHS for 7 days    No Known Allergies  Past Medical History  Diagnosis Date  . Hyperlipidemia   . Abdominal pain   . Endometriosis   . Vitamin D deficiency   . Thyroid disease     pt was hypo now hyperthyroid  . Bell's palsy   . Adenomyosis   . Dysmenorrhea   . Dyspareunia   . Infertility, female   . Urinary incontinence    Past Surgical History  Procedure Laterality Date  . Ectopic pregnancy surgery  1997    RIGHT TUBE  . Salpingectomy Right   . Iud removal  2013    mirena,only in for 6 months   Family History  Problem Relation Age of Onset  . Hypertension Father   . Hyperlipidemia Father    . Diabetes Father   . Thyroid disease Father   . Thyroid disease Sister   . Thyroid disease Mother   . Breast cancer Maternal Aunt   . Cancer - Colon Paternal Grandfather   . Thyroid disease Paternal Grandmother    History  Substance Use Topics  . Smoking status: Current Some Day Smoker -- 0.50 packs/day    Types: Cigarettes  . Smokeless tobacco: Never Used     Comment: in the process of quitting. smokes 8-12 per day  . Alcohol Use: Yes     Comment: SOCIAL 0-1 a month    ROS Constitutional: Denies fever, chills, weight loss/gain, headaches, insomnia, fatigue, night sweats, and change in appetite. Eyes: Denies redness, blurred vision, diplopia, discharge, itchy, watery eyes.  ENT: Denies discharge, congestion, post nasal drip, epistaxis, sore throat, earache, hearing loss, dental pain, Tinnitus, Vertigo, Sinus pain, snoring.  Cardio: Denies chest pain, palpitations, irregular heartbeat, syncope, dyspnea, diaphoresis, orthopnea, PND, claudication, edema Respiratory: denies cough, dyspnea, DOE, pleurisy, hoarseness, laryngitis, wheezing.  Gastrointestinal: Denies dysphagia, heartburn, reflux, water brash, pain, cramps, nausea, vomiting, bloating, diarrhea, constipation, hematemesis, melena, hematochezia, jaundice, hemorrhoids Genitourinary: Denies dysuria, frequency, urgency, nocturia, hesitancy, discharge, hematuria, flank pain Breast: Breast lumps, nipple discharge, bleeding.  Musculoskeletal: Denies arthralgia, myalgia, stiffness, Jt. Swelling, pain, limp, and strain/sprain. Denies falls.  Skin: Denies puritis, rash, hives, warts, acne, eczema, changing in skin lesion Neuro: No weakness, tremor, incoordination, spasms, paresthesia, pain Psychiatric: Denies confusion, memory loss, sensory loss. Denies Depression. Endocrine: Denies change in weight, skin, hair change, nocturia, and paresthesia, diabetic polys, visual blurring, hyper / hypo glycemic episodes.  Heme/Lymph: No excessive  bleeding, bruising, enlarged lymph nodes.  Physical Exam  BP 118/84  Pulse 76  Temp(Src) 98.8 F (37.1 C) (Temporal)  Resp 16  Ht 5\' 8"  (1.727 m)  Wt 196 lb 3.2 oz (88.996 kg)  BMI 29.84 kg/m2  General Appearance: Well nourished and in no apparent distress. Eyes: PERRLA, EOMs, conjunctiva no swelling or erythema, normal fundi and vessels. Sinuses: No frontal/maxillary tenderness ENT/Mouth: EACs patent / TMs  nl. Nares clear without erythema, swelling, mucoid exudates. Oral hygiene is good. No erythema, swelling, or exudate. Tongue normal, non-obstructing. Tonsils not swollen or erythematous. Hearing normal.  Neck: Supple, thyroid normal. No bruits, nodes or JVD. Respiratory: Respiratory effort normal.  BS equal and clear bilateral without rales, rhonci, wheezing or stridor. Cardio: Heart sounds are normal with regular rate and rhythm and no murmurs, rubs or gallops. Peripheral pulses are normal and equal bilaterally without edema. No aortic or femoral bruits. Chest: symmetric with normal excursions and percussion. Breasts: Symmetric, without lumps, nipple discharge, retractions, or fibrocystic changes.  Abdomen: Flat, soft, with bowl sounds. Nontender, no guarding, rebound, hernias, masses, or organomegaly.  Lymphatics: Non tender without lymphadenopathy.   Musculoskeletal: Full ROM all peripheral extremities, joint stability, 5/5 strength, and normal gait. Skin: Warm and dry without rashes, lesions, cyanosis, clubbing or  ecchymosis.  Neuro: Cranial nerves intact, reflexes equal bilaterally. Normal muscle tone, no cerebellar symptoms. Sensation intact.  Pysch: Awake and oriented X 3, normal affect, Insight and Judgment appropriate.   Assessment and Plan  1. Annual Screening Examination 2. Hypertension, Labile 3. Hyperlipidemia 4. Pre Diabetes 5. Vitamin D Deficiency 6. Hypothyroidism  Continue prudent diet as discussed, weight control, BP monitoring, regular exercise, and  medications. Discussed med's effects and SE's. Screening labs and tests as requested with regular follow-up as recommended.

## 2014-08-18 LAB — HEPATIC FUNCTION PANEL
ALBUMIN: 4.4 g/dL (ref 3.5–5.2)
ALT: 17 U/L (ref 0–35)
AST: 21 U/L (ref 0–37)
Alkaline Phosphatase: 65 U/L (ref 39–117)
Bilirubin, Direct: 0.2 mg/dL (ref 0.0–0.3)
Indirect Bilirubin: 0.7 mg/dL (ref 0.2–1.2)
TOTAL PROTEIN: 7.1 g/dL (ref 6.0–8.3)
Total Bilirubin: 0.9 mg/dL (ref 0.2–1.2)

## 2014-08-18 LAB — BASIC METABOLIC PANEL WITH GFR
BUN: 13 mg/dL (ref 6–23)
CALCIUM: 9.3 mg/dL (ref 8.4–10.5)
CO2: 25 mEq/L (ref 19–32)
Chloride: 106 mEq/L (ref 96–112)
Creat: 0.71 mg/dL (ref 0.50–1.10)
GFR, Est Non African American: >89 mL/min
GLUCOSE: 88 mg/dL (ref 70–99)
Potassium: 4.2 mEq/L (ref 3.5–5.3)
SODIUM: 141 meq/L (ref 135–145)

## 2014-08-18 LAB — HIV ANTIBODY (ROUTINE TESTING W REFLEX): HIV: NONREACTIVE

## 2014-08-18 LAB — LIPID PANEL
CHOLESTEROL: 245 mg/dL — AB (ref 0–200)
HDL: 44 mg/dL (ref >39)
LDL Cholesterol: 188 mg/dL — ABNORMAL HIGH (ref 0–99)
Total CHOL/HDL Ratio: 5.6 Ratio
Triglycerides: 64 mg/dL (ref <150)
VLDL: 13 mg/dL (ref 0–40)

## 2014-08-18 LAB — HEPATITIS C ANTIBODY: HCV Ab: NEGATIVE

## 2014-08-18 LAB — IRON AND TIBC
%SAT: 38 % (ref 20–55)
IRON: 133 ug/dL (ref 42–145)
TIBC: 349 ug/dL (ref 250–470)
UIBC: 216 ug/dL (ref 125–400)

## 2014-08-18 LAB — INSULIN, FASTING: Insulin fasting, serum: 3.9 u[IU]/mL (ref 2.0–19.6)

## 2014-08-18 LAB — URINALYSIS, MICROSCOPIC ONLY
BACTERIA UA: NONE SEEN
Casts: NONE SEEN
Crystals: NONE SEEN
Squamous Epithelial / LPF: NONE SEEN

## 2014-08-18 LAB — TSH: TSH: 12.496 u[IU]/mL — ABNORMAL HIGH (ref 0.350–4.500)

## 2014-08-18 LAB — HEPATITIS B SURFACE ANTIBODY,QUALITATIVE: Hep B S Ab: POSITIVE — AB

## 2014-08-18 LAB — RPR

## 2014-08-18 LAB — MICROALBUMIN / CREATININE URINE RATIO
Creatinine, Urine: 187.9 mg/dL
MICROALB/CREAT RATIO: 2.7 mg/g (ref 0.0–30.0)
Microalb, Ur: 0.5 mg/dL (ref 0.00–1.89)

## 2014-08-18 LAB — HEPATITIS A ANTIBODY, TOTAL: Hep A Total Ab: NONREACTIVE

## 2014-08-18 LAB — VITAMIN B12: Vitamin B-12: 532 pg/mL (ref 211–911)

## 2014-08-18 LAB — HEPATITIS B CORE ANTIBODY, TOTAL: Hep B Core Total Ab: NONREACTIVE

## 2014-08-18 LAB — MAGNESIUM: MAGNESIUM: 1.9 mg/dL (ref 1.5–2.5)

## 2014-08-18 LAB — VITAMIN D 25 HYDROXY (VIT D DEFICIENCY, FRACTURES): Vit D, 25-Hydroxy: 40 ng/mL (ref 30–89)

## 2014-08-20 LAB — HEPATITIS B E ANTIBODY: Hepatitis Be Antibody: NONREACTIVE

## 2014-08-20 LAB — TB SKIN TEST
Induration: 0 mm
TB Skin Test: NEGATIVE

## 2014-08-21 NOTE — Addendum Note (Signed)
Addended by: Unk Pinto on: 08/21/2014 05:40 PM   Modules accepted: Level of Service

## 2014-09-29 ENCOUNTER — Other Ambulatory Visit: Payer: Self-pay | Admitting: Internal Medicine

## 2014-10-01 ENCOUNTER — Other Ambulatory Visit: Payer: Self-pay

## 2014-10-18 ENCOUNTER — Encounter: Payer: Self-pay | Admitting: Internal Medicine

## 2014-11-18 ENCOUNTER — Encounter: Payer: Self-pay | Admitting: Physician Assistant

## 2014-11-18 ENCOUNTER — Ambulatory Visit (INDEPENDENT_AMBULATORY_CARE_PROVIDER_SITE_OTHER): Payer: BC Managed Care – PPO | Admitting: Physician Assistant

## 2014-11-18 VITALS — BP 128/78 | HR 88 | Temp 98.6°F | Resp 16 | Ht 67.25 in | Wt 202.0 lb

## 2014-11-18 DIAGNOSIS — Z720 Tobacco use: Secondary | ICD-10-CM

## 2014-11-18 DIAGNOSIS — R740 Nonspecific elevation of levels of transaminase and lactic acid dehydrogenase [LDH]: Secondary | ICD-10-CM

## 2014-11-18 DIAGNOSIS — M791 Myalgia, unspecified site: Secondary | ICD-10-CM

## 2014-11-18 DIAGNOSIS — F329 Major depressive disorder, single episode, unspecified: Secondary | ICD-10-CM

## 2014-11-18 DIAGNOSIS — E559 Vitamin D deficiency, unspecified: Secondary | ICD-10-CM

## 2014-11-18 DIAGNOSIS — E785 Hyperlipidemia, unspecified: Secondary | ICD-10-CM

## 2014-11-18 DIAGNOSIS — F17200 Nicotine dependence, unspecified, uncomplicated: Secondary | ICD-10-CM

## 2014-11-18 DIAGNOSIS — R5383 Other fatigue: Secondary | ICD-10-CM

## 2014-11-18 DIAGNOSIS — G4700 Insomnia, unspecified: Secondary | ICD-10-CM

## 2014-11-18 DIAGNOSIS — R7401 Elevation of levels of liver transaminase levels: Secondary | ICD-10-CM

## 2014-11-18 DIAGNOSIS — R7402 Elevation of levels of lactic acid dehydrogenase (LDH): Secondary | ICD-10-CM

## 2014-11-18 DIAGNOSIS — Z79899 Other long term (current) drug therapy: Secondary | ICD-10-CM

## 2014-11-18 DIAGNOSIS — F32A Depression, unspecified: Secondary | ICD-10-CM

## 2014-11-18 DIAGNOSIS — R7303 Prediabetes: Secondary | ICD-10-CM

## 2014-11-18 DIAGNOSIS — I10 Essential (primary) hypertension: Secondary | ICD-10-CM

## 2014-11-18 DIAGNOSIS — R7309 Other abnormal glucose: Secondary | ICD-10-CM

## 2014-11-18 LAB — LIPID PANEL
Cholesterol: 178 mg/dL (ref 0–200)
HDL: 39 mg/dL — AB (ref >39)
LDL Cholesterol: 115 mg/dL — ABNORMAL HIGH (ref 0–99)
Total CHOL/HDL Ratio: 4.6 Ratio
Triglycerides: 119 mg/dL (ref <150)
VLDL: 24 mg/dL (ref 0–40)

## 2014-11-18 LAB — CBC WITH DIFFERENTIAL/PLATELET
BASOS ABS: 0.1 10*3/uL (ref 0.0–0.1)
Basophils Relative: 1 % (ref 0–1)
EOS PCT: 6 % — AB (ref 0–5)
Eosinophils Absolute: 0.5 10*3/uL (ref 0.0–0.7)
HCT: 43.5 % (ref 36.0–46.0)
Hemoglobin: 14.3 g/dL (ref 12.0–15.0)
Lymphocytes Relative: 36 % (ref 12–46)
Lymphs Abs: 3.1 10*3/uL (ref 0.7–4.0)
MCH: 29.5 pg (ref 26.0–34.0)
MCHC: 32.9 g/dL (ref 30.0–36.0)
MCV: 89.7 fL (ref 78.0–100.0)
MPV: 10 fL (ref 9.4–12.4)
Monocytes Absolute: 0.7 10*3/uL (ref 0.1–1.0)
Monocytes Relative: 8 % (ref 3–12)
Neutro Abs: 4.3 10*3/uL (ref 1.7–7.7)
Neutrophils Relative %: 49 % (ref 43–77)
PLATELETS: 292 10*3/uL (ref 150–400)
RBC: 4.85 MIL/uL (ref 3.87–5.11)
RDW: 13.2 % (ref 11.5–15.5)
WBC: 8.7 10*3/uL (ref 4.0–10.5)

## 2014-11-18 LAB — BASIC METABOLIC PANEL WITH GFR
BUN: 15 mg/dL (ref 6–23)
CO2: 26 mEq/L (ref 19–32)
CREATININE: 0.96 mg/dL (ref 0.50–1.10)
Calcium: 9.1 mg/dL (ref 8.4–10.5)
Chloride: 106 mEq/L (ref 96–112)
GFR, EST NON AFRICAN AMERICAN: 75 mL/min
GFR, Est African American: 86 mL/min
Glucose, Bld: 81 mg/dL (ref 70–99)
Potassium: 4.3 mEq/L (ref 3.5–5.3)
Sodium: 139 mEq/L (ref 135–145)

## 2014-11-18 LAB — HEPATIC FUNCTION PANEL
ALBUMIN: 3.9 g/dL (ref 3.5–5.2)
ALT: 18 U/L (ref 0–35)
AST: 19 U/L (ref 0–37)
Alkaline Phosphatase: 66 U/L (ref 39–117)
Bilirubin, Direct: 0.1 mg/dL (ref 0.0–0.3)
Indirect Bilirubin: 0.3 mg/dL (ref 0.2–1.2)
TOTAL PROTEIN: 6.5 g/dL (ref 6.0–8.3)
Total Bilirubin: 0.4 mg/dL (ref 0.2–1.2)

## 2014-11-18 LAB — MAGNESIUM: MAGNESIUM: 1.9 mg/dL (ref 1.5–2.5)

## 2014-11-18 LAB — IRON AND TIBC
%SAT: 33 % (ref 20–55)
Iron: 104 ug/dL (ref 42–145)
TIBC: 319 ug/dL (ref 250–470)
UIBC: 215 ug/dL (ref 125–400)

## 2014-11-18 LAB — CK: CK TOTAL: 71 U/L (ref 7–177)

## 2014-11-18 MED ORDER — RANITIDINE HCL 300 MG PO TABS: 300.0000 mg | ORAL_TABLET | Freq: Every day | ORAL | Status: DC

## 2014-11-18 MED ORDER — CLONAZEPAM 0.5 MG PO TABS: ORAL_TABLET | ORAL | Status: DC

## 2014-11-18 MED ORDER — CITALOPRAM HYDROBROMIDE 40 MG PO TABS
40.0000 mg | ORAL_TABLET | Freq: Every day | ORAL | Status: DC
Start: 2014-11-18 — End: 2015-05-10

## 2014-11-18 NOTE — Patient Instructions (Addendum)
Zyrtec or certizine at night because it can make you sleepy Cheapest at [employer], [employer], [employer]  Try Celexa 40mg  start 1/2 pill daily, if you have any symptoms that you can not tolerate message me and we will switch it.   Make sure you are taking your thyroid medication on an empty stomach.   Can try melatonin 5mg -15 mg at night for sleep, can also do benadryl 25-50mg  at night for sleep.  If this does not help we can try prescription medication.  Also here is some information about good sleep hygiene.   Insomnia Insomnia is frequent trouble falling and/or staying asleep. Insomnia can be a long term problem or a short term problem. Both are common. Insomnia can be a short term problem when the wakefulness is related to a certain stress or worry. Long term insomnia is often related to ongoing stress during waking hours and/or poor sleeping habits. Overtime, sleep deprivation itself can make the problem worse. Every little thing feels more severe because you are overtired and your ability to cope is decreased. CAUSES   Stress, anxiety, and depression.  Poor sleeping habits.  Distractions such as TV in the bedroom.  Naps close to bedtime.  Engaging in emotionally charged conversations before bed.  Technical reading before sleep.  Alcohol and other sedatives. They may make the problem worse. They can hurt normal sleep patterns and normal dream activity.  Stimulants such as caffeine for several hours prior to bedtime.  Pain syndromes and shortness of breath can cause insomnia.  Exercise late at night.  Changing time zones may cause sleeping problems (jet lag). It is sometimes helpful to have someone observe your sleeping patterns. They should look for periods of not breathing during the night (sleep apnea). They should also look to see how long those periods last. If you live alone or observers are uncertain, you can also be observed at a sleep clinic where your sleep patterns will be  professionally monitored. Sleep apnea requires a checkup and treatment. Give your caregivers your medical history. Give your caregivers observations your family has made about your sleep.  SYMPTOMS   Not feeling rested in the morning.  Anxiety and restlessness at bedtime.  Difficulty falling and staying asleep. TREATMENT   Your caregiver may prescribe treatment for an underlying medical disorders. Your caregiver can give advice or help if you are using alcohol or other drugs for self-medication. Treatment of underlying problems will usually eliminate insomnia problems.  Medications can be prescribed for short time use. They are generally not recommended for lengthy use.  Over-the-counter sleep medicines are not recommended for lengthy use. They can be habit forming.  You can promote easier sleeping by making lifestyle changes such as:  Using relaxation techniques that help with breathing and reduce muscle tension.  Exercising earlier in the day.  Changing your diet and the time of your last meal. No night time snacks.  Establish a regular time to go to bed.  Counseling can help with stressful problems and worry.  Soothing music and white noise may be helpful if there are background noises you cannot remove.  Stop tedious detailed work at least one hour before bedtime. HOME CARE INSTRUCTIONS   Keep a diary. Inform your caregiver about your progress. This includes any medication side effects. See your caregiver regularly. Take note of:  Times when you are asleep.  Times when you are awake during the night.  The quality of your sleep.  How you feel the next day.  This information will help your caregiver care for you.  Get out of bed if you are still awake after 15 minutes. Read or do some quiet activity. Keep the lights down. Wait until you feel sleepy and go back to bed.  Keep regular sleeping and waking hours. Avoid naps.  Exercise regularly.  Avoid distractions at  bedtime. Distractions include watching television or engaging in any intense or detailed activity like attempting to balance the household checkbook.  Develop a bedtime ritual. Keep a familiar routine of bathing, brushing your teeth, climbing into bed at the same time each night, listening to soothing music. Routines increase the success of falling to sleep faster.  Use relaxation techniques. This can be using breathing and muscle tension release routines. It can also include visualizing peaceful scenes. You can also help control troubling or intruding thoughts by keeping your mind occupied with boring or repetitive thoughts like the old concept of counting sheep. You can make it more creative like imagining planting one beautiful flower after another in your backyard garden.  During your day, work to eliminate stress. When this is not possible use some of the previous suggestions to help reduce the anxiety that accompanies stressful situations. MAKE SURE YOU:   Understand these instructions.  Will watch your condition.  Will get help right away if you are not doing well or get worse. Document Released: 11/30/2000 Document Revised: 02/25/2012 Document Reviewed: 12/31/2007 [facility] Patient Information 2015 [place], [place]. This information is not intended to replace advice given to you by your health care provider. Make sure you discuss any questions you have with your health care provider.   Stop the crestor until I tell you otherwise, increase fluids.      Bad carbs also include fruit juice, alcohol, and sweet tea. These are empty calories that do not signal to your brain that you are full.   Please remember the good carbs are still carbs which convert into sugar. So please measure them out no more than 1/2-1 cup of rice, oatmeal, pasta, and beans  Veggies are however free foods! Pile them on.   Not all fruit is created equal. Please see the list below, the fruit at the bottom is higher  in sugars than the fruit at the top. Please avoid all dried fruits.

## 2014-11-18 NOTE — Progress Notes (Addendum)
Assessment and Plan:  Hypertension: Continue medication, monitor blood pressure at home. Continue DASH diet.  Reminder to go to the ER if any CP, SOB, nausea, dizziness, severe HA, changes vision/speech, left arm numbness and tingling, and jaw pain. Cholesterol: Continue diet and exercise. Check cholesterol.  Pre-diabetes-Continue diet and exercise. Check A1C Vitamin D Def- check level and continue medications.  Myalgias- stop statin, take NSAIDS PRN, increase fluids, will check CPK, TSH, ESR, magnesium, potassium, admits to recent tick exposure, will be check lyme/RMSF.  Depression/Anxiety- continue medications, stress management techniques discussed, increase water, good sleep hygiene discussed, increase exercise, and increase veggies.  Insomnia- good sleep hygiene discussed, increase day time activity, try melatonin or benadryl if this does not help we will call in sleep medication.  Hypothyroidism-check TSH level, continue medications the same, reminded to take on an empty stomach 30-55mns before food.    Continue diet and meds as discussed. Further disposition pending results of labs. OVER 40 minutes of exam, counseling, chart review, referral performed Follow up 1 month.   Addendum: Labs all negative so far. Myalgias possibly from depression, ? Fibromyalgia- will start on celexa, follow up in 1 month, will discuss other meds for possible Fibromyalgia.   HPI 38y.o. female  presents for 3 month follow up with hypertension, hyperlipidemia, prediabetes and vitamin D. Her blood pressure has been controlled at home, today their BP is BP: 128/78 mmHg She does not workout. She denies chest pain, shortness of breath, dizziness.  She is on cholesterol medication and is having myalgias. Her cholesterol is at goal. The cholesterol last visit was:   Lab Results  Component Value Date   CHOL 245* 08/17/2014   HDL 44 08/17/2014   LDLCALC 188* 08/17/2014   TRIG 64 08/17/2014   CHOLHDL 5.6  08/17/2014   She has been working on diet and exercise for prediabetes, and denies paresthesia of the feet, polydipsia and polyuria. Last A1C in the office was:  Lab Results  Component Value Date   HGBA1C 5.8* 08/17/2014   Patient is on Vitamin D supplement.   Lab Results  Component Value Date   VD25OH 40 08/17/2014     She states that she has diffuse muscle aches, bilateral knee pain, decreased sleeping, increasing anxiety, will cry randomly, "shawal rash" along chest, back and between breasts intermittently that is not pruritic, she has fatigue.  She has had negative ANA, antiDNA, sed rate, RF, Ddimer. Has had recent tick exposure. She is on thyroid medication. Her medication was changed last visit, she is on 150 daily.  Lab Results  Component Value Date   TSH 12.496* 08/17/2014  .  BMI is Body mass index is 31.41 kg/(m^2)., she is working on diet and exercise. Wt Readings from Last 3 Encounters:  11/18/14 202 lb (91.627 kg)  08/17/14 196 lb 3.2 oz (88.996 kg)  01/11/14 196 lb (88.905 kg)    Current Medications:  Current Outpatient Prescriptions on File Prior to Visit  Medication Sig Dispense Refill  . Cholecalciferol (VITAMIN D3) 10000 UNITS capsule Take 10,000 Units by mouth daily.    . diphenoxylate-atropine (LOMOTIL) 2.5-0.025 MG per tablet Take 1 tablet by mouth 4 (four) times daily as needed for diarrhea or loose stools. 120 tablet 1  . hyoscyamine (LEVSIN SL) 0.125 MG SL tablet Place 1 tablet (0.125 mg total) under the tongue every 4 (four) hours as needed. 30 tablet 2  . levothyroxine (SYNTHROID, LEVOTHROID) 150 MCG tablet TAKE 1 TABLET BY MOUTH ONCE DAILY FOR  THYROID 90 tablet PRN  . rosuvastatin (CRESTOR) 10 MG tablet Take 10 mg by mouth daily.     No current facility-administered medications on file prior to visit.   Medical History:  Past Medical History  Diagnosis Date  . Hyperlipidemia   . Abdominal pain   . Endometriosis   . Vitamin D deficiency   . Thyroid  disease     pt was hypo now hyperthyroid  . Bell's palsy   . Adenomyosis   . Dysmenorrhea   . Dyspareunia   . Infertility, female   . Urinary incontinence    Allergies: No Known Allergies   Review of Systems:  Review of Systems  Constitutional: Positive for malaise/fatigue. Negative for fever, chills, weight loss and diaphoresis.  HENT: Negative.   Eyes: Negative.   Respiratory: Negative.   Cardiovascular: Negative.   Gastrointestinal: Positive for heartburn. Negative for nausea, vomiting, abdominal pain, diarrhea, constipation, blood in stool and melena.  Genitourinary: Negative.   Musculoskeletal: Positive for myalgias and joint pain. Negative for back pain, falls and neck pain.  Skin: Positive for rash. Negative for itching.  Neurological: Negative.  Negative for weakness.  Psychiatric/Behavioral: Positive for depression. Negative for suicidal ideas, hallucinations, memory loss and substance abuse. The patient is nervous/anxious and has insomnia.     Family history- Review and unchanged Social history- Review and unchanged Physical Exam: BP 128/78 mmHg  Pulse 88  Temp(Src) 98.6 F (37 C)  Resp 16  Ht 5' 7.25" (1.708 m)  Wt 202 lb (91.627 kg)  BMI 31.41 kg/m2 Wt Readings from Last 3 Encounters:  11/18/14 202 lb (91.627 kg)  08/17/14 196 lb 3.2 oz (88.996 kg)  01/11/14 196 lb (88.905 kg)   General Appearance: Well nourished, in no apparent distress. Eyes: PERRLA, EOMs, conjunctiva no swelling or erythema Sinuses: No Frontal/maxillary tenderness ENT/Mouth: Ext aud canals clear, TMs without erythema, bulging. No erythema, swelling, or exudate on post pharynx.  Tonsils not swollen or erythematous. Hearing normal.  Neck: Supple, thyroid normal.  Respiratory: Respiratory effort normal, BS equal bilaterally without rales, rhonchi, wheezing or stridor.  Cardio: RRR with no MRGs. Brisk peripheral pulses without edema.  Abdomen: Soft, + BS.  Non tender, no guarding, rebound,  hernias, masses. Lymphatics: Non tender without lymphadenopathy.  Musculoskeletal: Full ROM, 5/5 strength, normal gait.  Skin: Warm, dry without rashes, lesions, ecchymosis. Diffuse erythematous macular rash along anterior chest and back.  Neuro: Cranial nerves intact. Normal muscle tone, no cerebellar symptoms. Sensation intact.  Psych: Awake and oriented X 3, normal affect, Insight and Judgment appropriate.    Vicie Mutters, PA-C 2:53 PM [facility]

## 2014-11-19 LAB — ROCKY MTN SPOTTED FVR ABS PNL(IGG+IGM)
RMSF IGG: 0.15 IV
RMSF IgM: 0.07 IV

## 2014-11-19 LAB — FERRITIN: FERRITIN: 75 ng/mL (ref 10–291)

## 2014-11-19 LAB — INSULIN, FASTING: INSULIN FASTING, SERUM: 12.2 u[IU]/mL (ref 2.0–19.6)

## 2014-11-19 LAB — TSH: TSH: 0.673 u[IU]/mL (ref 0.350–4.500)

## 2014-11-19 LAB — VITAMIN D 25 HYDROXY (VIT D DEFICIENCY, FRACTURES): VIT D 25 HYDROXY: 29 ng/mL — AB (ref 30–100)

## 2014-11-19 LAB — HEMOGLOBIN A1C
Hgb A1c MFr Bld: 5.6 % (ref <5.7)
MEAN PLASMA GLUCOSE: 114 mg/dL (ref <117)

## 2014-11-19 LAB — VITAMIN B12: Vitamin B-12: 503 pg/mL (ref 211–911)

## 2014-11-22 LAB — LYME ABY, WSTRN BLT IGG & IGM W/BANDS
B BURGDORFERI IGG ABS (IB): NEGATIVE
B burgdorferi IgM Abs (IB): NEGATIVE
LYME DISEASE 18 KD IGG: NONREACTIVE
LYME DISEASE 23 KD IGM: NONREACTIVE
LYME DISEASE 39 KD IGG: NONREACTIVE
LYME DISEASE 45 KD IGG: NONREACTIVE
LYME DISEASE 58 KD IGG: NONREACTIVE
LYME DISEASE 66 KD IGG: NONREACTIVE
Lyme Disease 23 kD IgG: NONREACTIVE
Lyme Disease 28 kD IgG: NONREACTIVE
Lyme Disease 30 kD IgG: NONREACTIVE
Lyme Disease 39 kD IgM: NONREACTIVE
Lyme Disease 41 kD IgG: NONREACTIVE
Lyme Disease 41 kD IgM: NONREACTIVE
Lyme Disease 93 kD IgG: NONREACTIVE

## 2014-12-21 ENCOUNTER — Ambulatory Visit: Payer: Self-pay | Admitting: Physician Assistant

## 2015-01-04 ENCOUNTER — Encounter: Payer: Self-pay | Admitting: Physician Assistant

## 2015-01-04 ENCOUNTER — Ambulatory Visit: Payer: Self-pay | Admitting: Physician Assistant

## 2015-02-23 ENCOUNTER — Ambulatory Visit: Payer: Self-pay | Admitting: Internal Medicine

## 2015-05-10 ENCOUNTER — Encounter: Payer: Self-pay | Admitting: Internal Medicine

## 2015-05-10 ENCOUNTER — Ambulatory Visit (INDEPENDENT_AMBULATORY_CARE_PROVIDER_SITE_OTHER): Payer: BLUE CROSS/BLUE SHIELD | Admitting: Internal Medicine

## 2015-05-10 VITALS — BP 152/100 | HR 84 | Temp 98.6°F | Resp 18 | Ht 67.25 in | Wt 213.0 lb

## 2015-05-10 DIAGNOSIS — R7309 Other abnormal glucose: Secondary | ICD-10-CM

## 2015-05-10 DIAGNOSIS — I10 Essential (primary) hypertension: Secondary | ICD-10-CM

## 2015-05-10 DIAGNOSIS — E559 Vitamin D deficiency, unspecified: Secondary | ICD-10-CM

## 2015-05-10 DIAGNOSIS — Z79899 Other long term (current) drug therapy: Secondary | ICD-10-CM

## 2015-05-10 DIAGNOSIS — E785 Hyperlipidemia, unspecified: Secondary | ICD-10-CM

## 2015-05-10 DIAGNOSIS — R7303 Prediabetes: Secondary | ICD-10-CM

## 2015-05-10 DIAGNOSIS — E039 Hypothyroidism, unspecified: Secondary | ICD-10-CM

## 2015-05-10 LAB — LIPID PANEL
CHOL/HDL RATIO: 9.6 ratio
CHOLESTEROL: 403 mg/dL — AB (ref 0–200)
HDL: 42 mg/dL — ABNORMAL LOW (ref >=46)
LDL Cholesterol: 298 mg/dL — ABNORMAL HIGH (ref 0–99)
TRIGLYCERIDES: 314 mg/dL — AB (ref <150)
VLDL: 63 mg/dL — ABNORMAL HIGH (ref 0–40)

## 2015-05-10 LAB — CBC WITH DIFFERENTIAL/PLATELET
BASOS PCT: 2 % — AB (ref 0–1)
Basophils Absolute: 0.2 10*3/uL — ABNORMAL HIGH (ref 0.0–0.1)
EOS PCT: 5 % (ref 0–5)
Eosinophils Absolute: 0.4 10*3/uL (ref 0.0–0.7)
HEMATOCRIT: 42.5 % (ref 36.0–46.0)
Hemoglobin: 14.4 g/dL (ref 12.0–15.0)
LYMPHS ABS: 3.2 10*3/uL (ref 0.7–4.0)
LYMPHS PCT: 38 % (ref 12–46)
MCH: 30.3 pg (ref 26.0–34.0)
MCHC: 33.9 g/dL (ref 30.0–36.0)
MCV: 89.5 fL (ref 78.0–100.0)
MPV: 10.8 fL (ref 8.6–12.4)
Monocytes Absolute: 0.6 10*3/uL (ref 0.1–1.0)
Monocytes Relative: 7 % (ref 3–12)
Neutro Abs: 4 10*3/uL (ref 1.7–7.7)
Neutrophils Relative %: 48 % (ref 43–77)
PLATELETS: 226 10*3/uL (ref 150–400)
RBC: 4.75 MIL/uL (ref 3.87–5.11)
RDW: 14.7 % (ref 11.5–15.5)
WBC: 8.4 10*3/uL (ref 4.0–10.5)

## 2015-05-10 LAB — BASIC METABOLIC PANEL WITH GFR
BUN: 12 mg/dL (ref 6–23)
CO2: 32 mEq/L (ref 19–32)
Calcium: 10 mg/dL (ref 8.4–10.5)
Chloride: 100 mEq/L (ref 96–112)
Creat: 1.17 mg/dL — ABNORMAL HIGH (ref 0.50–1.10)
GFR, EST AFRICAN AMERICAN: 68 mL/min
GFR, Est Non African American: 59 mL/min — ABNORMAL LOW
GLUCOSE: 67 mg/dL — AB (ref 70–99)
Potassium: 4.3 mEq/L (ref 3.5–5.3)
Sodium: 139 mEq/L (ref 135–145)

## 2015-05-10 LAB — MAGNESIUM: Magnesium: 2.2 mg/dL (ref 1.5–2.5)

## 2015-05-10 LAB — HEPATIC FUNCTION PANEL
ALK PHOS: 57 U/L (ref 39–117)
ALT: 92 U/L — AB (ref 0–35)
AST: 81 U/L — ABNORMAL HIGH (ref 0–37)
Albumin: 4.6 g/dL (ref 3.5–5.2)
BILIRUBIN DIRECT: 0.1 mg/dL (ref 0.0–0.3)
Indirect Bilirubin: 0.5 mg/dL (ref 0.2–1.2)
Total Bilirubin: 0.6 mg/dL (ref 0.2–1.2)
Total Protein: 7.6 g/dL (ref 6.0–8.3)

## 2015-05-10 LAB — TSH: TSH: 105.118 u[IU]/mL — ABNORMAL HIGH (ref 0.350–4.500)

## 2015-05-10 MED ORDER — BISOPROLOL-HYDROCHLOROTHIAZIDE 5-6.25 MG PO TABS: 1.0000 | ORAL_TABLET | Freq: Every day | ORAL | Status: DC

## 2015-05-10 NOTE — Progress Notes (Signed)
Patient ID: Tammy Gibson, female   DOB: [birth year], 39 y.o.   MRN: [id number]  Assessment and Plan:   1. Essential hypertension -monitor at home and keep BP log - bisoprolol-hydrochlorothiazide (ZIAC) 5-6.25 MG per tablet; Take 1 tablet by mouth daily.  Dispense: 30 tablet; Refill: 0  2. Hypothyroidism, unspecified hypothyroidism type -previous lab reading of 122 performed over the weekend -if continued to be elevated consider sending to endocrine for further evaluation or possible RAIU scan - TSH  3. Prediabetes -diet and exercise - Hemoglobin A1c - Insulin, random  4. Hyperlipidemia -cont medication - Lipid panel  5. Vitamin D deficiency -cont supplement - Vitamin D 1,25 dihydroxy  6. Medication management  - CBC with Differential/Platelet - BASIC METABOLIC PANEL WITH GFR - Hepatic function panel - Magnesium  7.  Allergic rhinitis -prednisone dose pak -zyrtec -nasal saline     Continue diet and meds as discussed. Further disposition pending results of labs.  HPI 39 y.o. female  presents for 3 month follow up with hypertension, hyperlipidemia, prediabetes and vitamin D.   Her blood pressure has not been controlled at home, today their BP is BP: (!) 152/100 mmHg.   She does not workout.  She does doe some exercise occasionally but not consistently.   She denies chest pain, shortness of breath, dizziness.   She is on cholesterol medication and denies myalgias. Her cholesterol is at goal. The cholesterol last visit was:   Lab Results  Component Value Date   CHOL 178 11/18/2014   HDL 39* 11/18/2014   LDLCALC 115* 11/18/2014   TRIG 119 11/18/2014   CHOLHDL 4.6 11/18/2014     She has been working on diet and exercise for prediabetes, and denies foot ulcerations, hyperglycemia, hypoglycemia , increased appetite, nausea, paresthesia of the feet, polydipsia, polyuria, visual disturbances, vomiting and weight loss. Last A1C in the office was:  Lab Results  Component  Value Date   HGBA1C 5.6 11/18/2014    Patient is on Vitamin D supplement.  Lab Results  Component Value Date   VD25OH 29* 11/18/2014     Patient complaining of facial swelling, nasal drainage, and also some swelling around the eyes.  She reports that she went to the urgent care and they told her that her thyroid has been a lot worse and that she needed to come see [place].  She reports some hoarseness and cold intolerance.    She reports that she has been taking her thyroid medications.  She reports that she has been feeling fine.  She reports that she usually takes with on an empty and then waits two hours before eating.  Chart review reveals that patient has had very labile thyroid function which has been extremely difficult to control here in the office.  Current Medications:  Current Outpatient Prescriptions on File Prior to Visit  Medication Sig Dispense Refill  . Cholecalciferol (VITAMIN D3) 10000 UNITS capsule Take 10,000 Units by mouth daily.    Marland Kitchen levothyroxine (SYNTHROID, LEVOTHROID) 150 MCG tablet TAKE 1 TABLET BY MOUTH ONCE DAILY FOR THYROID 90 tablet PRN  . rosuvastatin (CRESTOR) 10 MG tablet Take 10 mg by mouth daily.    . diphenoxylate-atropine (LOMOTIL) 2.5-0.025 MG per tablet Take 1 tablet by mouth 4 (four) times daily as needed for diarrhea or loose stools. (Patient not taking: Reported on 05/10/2015) 120 tablet 1  . hyoscyamine (LEVSIN SL) 0.125 MG SL tablet Place 1 tablet (0.125 mg total) under the tongue every 4 (four) hours as  needed. (Patient not taking: Reported on 05/10/2015) 30 tablet 2   No current facility-administered medications on file prior to visit.    Medical History:  Past Medical History  Diagnosis Date  . Hyperlipidemia   . Abdominal pain   . Endometriosis   . Vitamin D deficiency   . Thyroid disease     pt was hypo now hyperthyroid  . Bell's palsy   . Adenomyosis   . Dysmenorrhea   . Dyspareunia   . Infertility, female   . Urinary incontinence      Allergies: No Known Allergies   Review of Systems:  Review of Systems  Constitutional: Positive for malaise/fatigue. Negative for fever and chills.  HENT: Positive for congestion. Negative for ear pain and sore throat.        Hoarsness  Respiratory: Positive for cough. Negative for shortness of breath and wheezing.   Cardiovascular: Positive for leg swelling. Negative for chest pain and palpitations.  Gastrointestinal: Positive for heartburn. Negative for nausea, vomiting, diarrhea, constipation, blood in stool and melena.  Genitourinary: Negative for dysuria, urgency and frequency.  Musculoskeletal: Positive for myalgias.  Skin: Positive for rash.  Neurological: Negative for dizziness, sensory change, loss of consciousness and headaches.  Endo/Heme/Allergies:       Cold intolerance  Psychiatric/Behavioral: Negative for depression. The patient is not nervous/anxious and does not have insomnia.     Family history- Review and unchanged  Social history- Review and unchanged  Physical Exam: BP 152/100 mmHg  Pulse 84  Temp(Src) 98.6 F (37 C) (Temporal)  Resp 18  Ht 5' 7.25" (1.708 m)  Wt 213 lb (96.616 kg)  BMI 33.12 kg/m2  LMP 05/03/2015 Wt Readings from Last 3 Encounters:  05/10/15 213 lb (96.616 kg)  11/18/14 202 lb (91.627 kg)  08/17/14 196 lb 3.2 oz (88.996 kg)    General Appearance: Well nourished well developed, in no apparent distress. Eyes: PERRLA, EOMs, Mild periorbital edema conjunctiva no swelling or erythema ENT/Mouth: Ear canals normal without obstruction, swelling, erythma, discharge.  TMs normal bilaterally.  Oropharynx moist, clear, without exudate, or postoropharyngeal swelling. Neck: Supple, thyroid with minimally palpable nodule on right lobe,no cervical adenopathy  Respiratory: Respiratory effort normal, Breath sounds clear A&P without rhonchi, wheeze, or rale.  No retractions, no accessory usage. Cardio: RRR with no MRGs. Brisk peripheral pulses  without  Pitting edema.  Abdomen: Soft, + BS,  Non tender, no guarding, rebound, hernias, masses. Musculoskeletal: Full ROM, 5/5 strength, Normal gait Skin: Warm, dry without rashes, lesions, ecchymosis.  Neuro: Awake and oriented X 3, Cranial nerves intact. Normal muscle tone, no cerebellar symptoms. Psych: Normal affect, Insight and Judgment appropriate.    FORCUCCI, Sentoria Brent, PA-C 12:06 PM [facility]

## 2015-05-10 NOTE — Patient Instructions (Signed)
Hypothyroidism The thyroid is a large gland located in the lower front of your neck. The thyroid gland helps control metabolism. Metabolism is how your body handles food. It controls metabolism with the hormone thyroxine. When this gland is underactive (hypothyroid), it produces too little hormone.  CAUSES These include:   Absence or destruction of thyroid tissue.  Goiter due to iodine deficiency.  Goiter due to medications.  Congenital defects (since birth).  Problems with the pituitary. This causes a lack of TSH (thyroid stimulating hormone). This hormone tells the thyroid to turn out more hormone. SYMPTOMS  Lethargy (feeling as though you have no energy)  Cold intolerance  Weight gain (in spite of normal food intake)  Dry skin  Coarse hair  Menstrual irregularity (if severe, may lead to infertility)  Slowing of thought processes Cardiac problems are also caused by insufficient amounts of thyroid hormone. Hypothyroidism in the newborn is cretinism, and is an extreme form. It is important that this form be treated adequately and immediately or it will lead rapidly to retarded physical and mental development. DIAGNOSIS  To prove hypothyroidism, your caregiver may do blood tests and ultrasound tests. Sometimes the signs are hidden. It may be necessary for your caregiver to watch this illness with blood tests either before or after diagnosis and treatment. TREATMENT  Low levels of thyroid hormone are increased by using synthetic thyroid hormone. This is a safe, effective treatment. It usually takes about four weeks to gain the full effects of the medication. After you have the full effect of the medication, it will generally take another four weeks for problems to leave. Your caregiver may start you on low doses. If you have had heart problems the dose may be gradually increased. It is generally not an emergency to get rapidly to normal. HOME CARE INSTRUCTIONS   Take your  medications as your caregiver suggests. Let your caregiver know of any medications you are taking or start taking. Your caregiver will help you with dosage schedules.  As your condition improves, your dosage needs may increase. It will be necessary to have continuing blood tests as suggested by your caregiver.  Report all suspected medication side effects to your caregiver. SEEK MEDICAL CARE IF: Seek medical care if you develop:  Sweating.  Tremulousness (tremors).  Anxiety.  Rapid weight loss.  Heat intolerance.  Emotional swings.  Diarrhea.  Weakness. SEEK IMMEDIATE MEDICAL CARE IF:  You develop chest pain, an irregular heart beat (palpitations), or a rapid heart beat. MAKE SURE YOU:   Understand these instructions.  Will watch your condition.  Will get help right away if you are not doing well or get worse. Document Released: 12/03/2005 Document Revised: 02/25/2012 Document Reviewed: 07/23/2008 ExitCare Patient Information 2015 [employer]. This information is not intended to replace advice given to you by your health care provider. Make sure you discuss any questions you have with your health care provider.  

## 2015-05-11 ENCOUNTER — Other Ambulatory Visit: Payer: Self-pay | Admitting: Internal Medicine

## 2015-05-11 LAB — HEMOGLOBIN A1C
HEMOGLOBIN A1C: 6.1 % — AB (ref <5.7)
Mean Plasma Glucose: 128 mg/dL — ABNORMAL HIGH (ref <117)

## 2015-05-11 LAB — INSULIN, RANDOM: INSULIN: 5.8 u[IU]/mL (ref 2.0–19.6)

## 2015-05-11 MED ORDER — LEVOTHYROXINE SODIUM 200 MCG PO TABS: 200.0000 ug | ORAL_TABLET | Freq: Every day | ORAL | Status: DC

## 2015-05-12 ENCOUNTER — Other Ambulatory Visit: Payer: Self-pay | Admitting: Internal Medicine

## 2015-05-12 ENCOUNTER — Encounter: Payer: Self-pay | Admitting: Internal Medicine

## 2015-05-12 LAB — VITAMIN D 1,25 DIHYDROXY
VITAMIN D 1, 25 (OH) TOTAL: 75 pg/mL — AB (ref 18–72)
VITAMIN D3 1, 25 (OH): 75 pg/mL
Vitamin D2 1, 25 (OH)2: <8 pg/mL

## 2015-05-12 MED ORDER — TRAMADOL HCL 50 MG PO TABS: 50.0000 mg | ORAL_TABLET | Freq: Four times a day (QID) | ORAL | Status: AC | PRN

## 2015-06-13 ENCOUNTER — Ambulatory Visit (INDEPENDENT_AMBULATORY_CARE_PROVIDER_SITE_OTHER): Payer: BLUE CROSS/BLUE SHIELD | Admitting: Internal Medicine

## 2015-06-13 ENCOUNTER — Encounter: Payer: Self-pay | Admitting: Internal Medicine

## 2015-06-13 VITALS — BP 110/64 | HR 64 | Temp 98.4°F | Resp 18 | Ht 67.25 in | Wt 206.0 lb

## 2015-06-13 DIAGNOSIS — I10 Essential (primary) hypertension: Secondary | ICD-10-CM

## 2015-06-13 DIAGNOSIS — E039 Hypothyroidism, unspecified: Secondary | ICD-10-CM

## 2015-06-13 MED ORDER — DICYCLOMINE HCL 20 MG PO TABS: 20.0000 mg | ORAL_TABLET | Freq: Three times a day (TID) | ORAL | Status: DC

## 2015-06-13 MED ORDER — BISOPROLOL-HYDROCHLOROTHIAZIDE 5-6.25 MG PO TABS: 1.0000 | ORAL_TABLET | Freq: Every day | ORAL | Status: DC

## 2015-06-13 NOTE — Patient Instructions (Signed)
Smoking Cessation Quitting smoking is important to your health and has many advantages. However, it is not always easy to quit since nicotine is a very addictive drug. Oftentimes, people try 3 times or more before being able to quit. This document explains the best ways for you to prepare to quit smoking. Quitting takes hard work and a lot of effort, but you can do it. ADVANTAGES OF QUITTING SMOKING  You will live longer, feel better, and live better.  Your body will feel the impact of quitting smoking almost immediately.  Within 20 minutes, blood pressure decreases. Your pulse returns to its normal level.  After 8 hours, carbon monoxide levels in the blood return to normal. Your oxygen level increases.  After 24 hours, the chance of having a heart attack starts to decrease. Your breath, hair, and body stop smelling like smoke.  After 48 hours, damaged nerve endings begin to recover. Your sense of taste and smell improve.  After 72 hours, the body is virtually free of nicotine. Your bronchial tubes relax and breathing becomes easier.  After 2 to 12 weeks, lungs can hold more air. Exercise becomes easier and circulation improves.  The risk of having a heart attack, stroke, cancer, or lung disease is greatly reduced.  After 1 year, the risk of coronary heart disease is cut in half.  After 5 years, the risk of stroke falls to the same as a nonsmoker.  After 10 years, the risk of lung cancer is cut in half and the risk of other cancers decreases significantly.  After 15 years, the risk of coronary heart disease drops, usually to the level of a nonsmoker.  If you are pregnant, quitting smoking will improve your chances of having a healthy baby.  The people you live with, especially any children, will be healthier.  You will have extra money to spend on things other than cigarettes. QUESTIONS TO THINK ABOUT BEFORE ATTEMPTING TO QUIT You may want to talk about your answers with your  health care provider.  Why do you want to quit?  If you tried to quit in the past, what helped and what did not?  What will be the most difficult situations for you after you quit? How will you plan to handle them?  Who can help you through the tough times? Your family? Friends? A health care provider?  What pleasures do you get from smoking? What ways can you still get pleasure if you quit? Here are some questions to ask your health care provider:  How can you help me to be successful at quitting?  What medicine do you think would be best for me and how should I take it?  What should I do if I need more help?  What is smoking withdrawal like? How can I get information on withdrawal? GET READY  Set a quit date.  Change your environment by getting rid of all cigarettes, ashtrays, matches, and lighters in your home, car, or work. Do not let people smoke in your home.  Review your past attempts to quit. Think about what worked and what did not. GET SUPPORT AND ENCOURAGEMENT You have a better chance of being successful if you have help. You can get support in many ways.  Tell your family, friends, and coworkers that you are going to quit and need their support. Ask them not to smoke around you.  Get individual, group, or telephone counseling and support. Programs are available at local hospitals and health centers. Call   your local health department for information about programs in your area.  Spiritual beliefs and practices may help some smokers quit.  Download a "quit meter" on your computer to keep track of quit statistics, such as how long you have gone without smoking, cigarettes not smoked, and money saved.  Get a self-help book about quitting smoking and staying off tobacco. LEARN NEW SKILLS AND BEHAVIORS  Distract yourself from urges to smoke. Talk to someone, go for a walk, or occupy your time with a task.  Change your normal routine. Take a different route to work.  Drink tea instead of coffee. Eat breakfast in a different place.  Reduce your stress. Take a hot bath, exercise, or read a book.  Plan something enjoyable to do every day. Reward yourself for not smoking.  Explore interactive web-based programs that specialize in helping you quit. GET MEDICINE AND USE IT CORRECTLY Medicines can help you stop smoking and decrease the urge to smoke. Combining medicine with the above behavioral methods and support can greatly increase your chances of successfully quitting smoking.  Nicotine replacement therapy helps deliver nicotine to your body without the negative effects and risks of smoking. Nicotine replacement therapy includes nicotine gum, lozenges, inhalers, nasal sprays, and skin patches. Some may be available over-the-counter and others require a prescription.  Antidepressant medicine helps people abstain from smoking, but how this works is unknown. This medicine is available by prescription.  Nicotinic receptor partial agonist medicine simulates the effect of nicotine in your brain. This medicine is available by prescription. Ask your health care provider for advice about which medicines to use and how to use them based on your health history. Your health care provider will tell you what side effects to look out for if you choose to be on a medicine or therapy. Carefully read the information on the package. Do not use any other product containing nicotine while using a nicotine replacement product.  RELAPSE OR DIFFICULT SITUATIONS Most relapses occur within the first 3 months after quitting. Do not be discouraged if you start smoking again. Remember, most people try several times before finally quitting. You may have symptoms of withdrawal because your body is used to nicotine. You may crave cigarettes, be irritable, feel very hungry, cough often, get headaches, or have difficulty concentrating. The withdrawal symptoms are only temporary. They are strongest  when you first quit, but they will go away within 10-14 days. To reduce the chances of relapse, try to:  Avoid drinking alcohol. Drinking lowers your chances of successfully quitting.  Reduce the amount of caffeine you consume. Once you quit smoking, the amount of caffeine in your body increases and can give you symptoms, such as a rapid heartbeat, sweating, and anxiety.  Avoid smokers because they can make you want to smoke.  Do not let weight gain distract you. Many smokers will gain weight when they quit, usually less than 10 pounds. Eat a healthy diet and stay active. You can always lose the weight gained after you quit.  Find ways to improve your mood other than smoking. FOR MORE INFORMATION  www.smokefree.gov  Document Released: 11/27/2001 Document Revised: 04/19/2014 Document Reviewed: 03/13/2012 ExitCare Patient Information 2015 [employer]. This information is not intended to replace advice given to you by your health care provider. Make sure you discuss any questions you have with your health care provider.  

## 2015-06-13 NOTE — Progress Notes (Signed)
   Subjective:    Patient ID: Tammy Gibson, female    DOB: 15-Feb-1976, 39 y.o.   MRN: [id number]  HPI  Patient returns to the office for recheck of BP and also for reevaluation of hypothyroidism.  She reports that she is taking both ziac and her new levothyroxine.  She feels significantly better.  She reports that she still has some hoarseness and her hair is still falling out.  She reports that there have been no adverse effects from the medications.    Review of Systems  Constitutional: Positive for fatigue. Negative for fever and chills.  HENT: Positive for trouble swallowing and voice change. Negative for dental problem, ear discharge, hearing loss, postnasal drip, sinus pressure and sore throat.   Respiratory: Negative for cough, chest tightness and shortness of breath.   Cardiovascular: Negative for chest pain, palpitations and leg swelling.  Gastrointestinal: Positive for abdominal pain and constipation. Negative for nausea, vomiting, diarrhea and blood in stool.  Endocrine: Negative for cold intolerance.  Skin: Positive for rash.       Objective:   Physical Exam  Constitutional: She is oriented to person, place, and time. She appears well-developed and well-nourished. No distress.  HENT:  Head: Normocephalic and atraumatic.  Nose: Nose normal.  Mouth/Throat: Uvula is midline, oropharynx is clear and moist and mucous membranes are normal. No trismus in the jaw. No oropharyngeal exudate, posterior oropharyngeal edema, posterior oropharyngeal erythema or tonsillar abscesses.  Eyes: Conjunctivae are normal. No scleral icterus.  Neck: Normal range of motion. Neck supple. No JVD present.  Questionable palpable nodule on the right lobe of the thyroid.  Noticible hoarseness  Cardiovascular: Normal rate, regular rhythm, normal heart sounds and intact distal pulses.  Exam reveals no gallop and no friction rub.   No murmur heard. Pulmonary/Chest: Effort normal and breath sounds normal. No  respiratory distress. She has no wheezes. She has no rales. She exhibits no tenderness.  Abdominal: Soft. Bowel sounds are normal. She exhibits no distension and no mass. There is no tenderness. There is no rebound and no guarding.  Musculoskeletal: Normal range of motion.  Lymphadenopathy:    She has no cervical adenopathy.  Neurological: She is alert and oriented to person, place, and time.  Skin: Skin is warm and dry. She is not diaphoretic.  Psychiatric: She has a normal mood and affect. Her behavior is normal. Judgment and thought content normal.  Nursing note and vitals reviewed.   Filed Vitals:   06/13/15 1629  BP: 110/64  Pulse: 64  Temp: 98.4 F (36.9 C)  Resp: 18         Assessment & Plan:    1. Hypothyroidism, unspecified hypothyroidism type  - US Soft Tissue Head/Neck; Future - TSH  2. Essential hypertension -doing great cont to monitor and cont meds - bisoprolol-hydrochlorothiazide (ZIAC) 5-6.25 MG per tablet; Take 1 tablet by mouth daily.  Dispense: 90 tablet; Refill: 1

## 2015-06-14 LAB — TSH: TSH: 23.561 u[IU]/mL — AB (ref 0.350–4.500)

## 2015-06-15 ENCOUNTER — Encounter: Payer: Self-pay | Admitting: Internal Medicine

## 2015-06-20 ENCOUNTER — Encounter: Payer: Self-pay | Admitting: *Deleted

## 2015-06-21 ENCOUNTER — Other Ambulatory Visit: Payer: Self-pay | Admitting: Internal Medicine

## 2015-06-21 ENCOUNTER — Ambulatory Visit (HOSPITAL_COMMUNITY)
Admission: RE | Admit: 2015-06-21 | Discharge: 2015-06-21 | Disposition: A | Discharge status: Home / Self Care | Payer: BLUE CROSS/BLUE SHIELD | Source: Ambulatory Visit | Attending: Internal Medicine | Admitting: Internal Medicine

## 2015-06-21 DIAGNOSIS — E039 Hypothyroidism, unspecified: Secondary | ICD-10-CM | POA: Diagnosis present

## 2015-06-21 DIAGNOSIS — R1310 Dysphagia, unspecified: Secondary | ICD-10-CM

## 2015-08-21 ENCOUNTER — Encounter: Payer: Self-pay | Admitting: Internal Medicine

## 2015-08-21 DIAGNOSIS — E669 Obesity, unspecified: Secondary | ICD-10-CM | POA: Insufficient documentation

## 2015-08-21 NOTE — Patient Instructions (Signed)

## 2015-08-21 NOTE — Progress Notes (Signed)
Patient ID: Tammy Gibson, female   DOB: September 16, 1976, 39 y.o.   MRN: [id number]   Comprehensive Examination  This very nice 39 y.o. MWF presents for complete physical.  Patient has been followed for labile HTN, Prediabetes, Hyperlipidemia, and Vitamin D Deficiency. Patient is also hypothyroid and has been on replacement therapy.    Patient has labile HTN predates since  2012 and has been monitored expectantly. Patient denies any cardiac symptoms as chest pain, palpitations, shortness of breath, dizziness or ankle swelling. Today's BP: 134/86 mmHg    Patient's hyperlipidemia is not controlled with diet and medications. Patient denies myalgias or other medication SE's. Medication compliance has been questionable. Last lipids were not at goal - Cholesterol 403*; HDL 42*; LDL 298*; and elevated Triglycerides 314 on 05/10/2015   Patient has Morbid Obesity (BMI 32+)  And consequent prediabetes predating since  Dec 2914 with A1c 5.8% and A1c 5.8% in Dec 2015  and patient denies reactive hypoglycemic symptoms, visual blurring, diabetic polys, or paresthesias. Last A1c was 6.1% on 05/10/2015. Patient demonstrates poor insight wrt the poor food choices she makes contributatory  to her obesity.    Finally, patient has history of Vitamin D Deficiency of 28 in Aug 2014 and last Vitamin D was 29 on 11/18/2014.      Medication Sig  . bisoprolol-hctz [facility]) 5-6.25  Take 1 tablet by mouth daily.  Marland Kitchen VITAMIN D 06269 UNITS  Take 10,000 Units by mouth daily.  Marland Kitchen dicyclomine  20 MG tablet Take 1 tablet (20 mg total) by mouth 4 (four) times daily -  before meals and at bedtime.  Marland Kitchen levothyroxine  200 MCG tablet Take 1 tablet (200 mcg total) by mouth daily.  . rosuvastatin 10 MG tablet Take 10 mg by mouth daily.  . traMADol (ULTRAM) 50 MG tablet Take 1 tablet (50 mg total) by mouth every 6 (six) hours as needed.   No Known Allergies   Past Medical History  Diagnosis Date  . Hyperlipidemia   . Abdominal pain   .  Endometriosis   . Vitamin D deficiency   . Thyroid disease     pt was hypo now hyperthyroid  . Bell's palsy   . Adenomyosis   . Dysmenorrhea   . Dyspareunia   . Infertility, female   . Urinary incontinence    Health Maintenance  Topic Date Due  . INFLUENZA VACCINE  07/18/2015  . PAP SMEAR  01/11/2017  . TETANUS/TDAP  12/17/2018  . HIV Screening  Completed   Immunization History  Administered Date(s) Administered  . Hepatitis B 01/11/2011  . Influenza-Unspecified 10/11/2013  . PPD Test 08/17/2014  . Td 12/17/2008   Past Surgical History  Procedure Laterality Date  . Ectopic pregnancy surgery  1997    RIGHT TUBE  . Salpingectomy Right   . Iud removal  2013    mirena,only in for 6 months   Family History  Problem Relation Age of Onset  . Hypertension Father   . Hyperlipidemia Father   . Diabetes Father   . Thyroid disease Father   . Thyroid disease Sister   . Thyroid disease Mother   . Breast cancer Maternal Aunt   . Cancer - Colon Paternal Grandfather   . Thyroid disease Paternal Grandmother    Social History  Substance Use Topics  . Smoking status: Current Every Day Smoker -- 0.50 packs/day    Types: Cigarettes  . Smokeless tobacco: Never Used     Comment: in the process of  quitting. smokes 8-12 per day  . Alcohol Use: No     Comment: Patient stopped alcohol due to elevated liver enzymes.    ROS Constitutional: Denies fever, chills, weight loss/gain, headaches, insomnia,  night sweats, and change in appetite. Does c/o fatigue. Eyes: Denies redness, blurred vision, diplopia, discharge, itchy, watery eyes.  ENT: Denies discharge, congestion, post nasal drip, epistaxis, sore throat, earache, hearing loss, dental pain, Tinnitus, Vertigo, Sinus pain, snoring.  Cardio: Denies chest pain, palpitations, irregular heartbeat, syncope, dyspnea, diaphoresis, orthopnea, PND, claudication, edema Respiratory: denies cough, dyspnea, DOE, pleurisy, hoarseness, laryngitis,  wheezing.  Gastrointestinal: Denies dysphagia, heartburn, reflux, water brash, pain, cramps, nausea, vomiting, bloating, diarrhea, constipation, hematemesis, melena, hematochezia, jaundice, hemorrhoids Genitourinary: Denies dysuria, frequency, urgency, nocturia, hesitancy, discharge, hematuria, flank pain Breast: Breast lumps, nipple discharge, bleeding.  Musculoskeletal: Denies arthralgia, myalgia, stiffness, Jt. Swelling, pain, limp, and strain/sprain. Denies falls. Skin: Denies puritis, rash, hives, warts, acne, eczema, changing in skin lesion Neuro: No weakness, tremor, incoordination, spasms, paresthesia, pain Psychiatric: Denies confusion, memory loss, sensory loss. Denies Depression. Endocrine: Denies change in weight, skin, hair change, nocturia, and paresthesia, diabetic polys, visual blurring, hyper / hypo glycemic episodes.  Heme/Lymph: No excessive bleeding, bruising, enlarged lymph nodes.  Physical Exam  BP 134/86 mmHg  Pulse 88  Temp(Src) 98.1 F (36.7 C)  Resp 16  Ht 5\' 8"  (1.727 m)  Wt 212 lb 6.4 oz (96.344 kg)  BMI 32.30 kg/m2  General Appearance: Well nourished and in no apparent distress. Eyes: PERRLA, EOMs, conjunctiva no swelling or erythema, normal fundi and vessels. Sinuses: No frontal/maxillary tenderness ENT/Mouth: EACs patent / TMs  nl. Nares clear without erythema, swelling, mucoid exudates. Oral hygiene is good. No erythema, swelling, or exudate. Tongue normal, non-obstructing. Tonsils not swollen or erythematous. Hearing normal.  Neck: Supple, thyroid normal. No bruits, nodes or JVD. Respiratory: Respiratory effort normal.  BS equal and clear bilateral without rales, rhonci, wheezing or stridor. Cardio: Heart sounds are normal with regular rate and rhythm and no murmurs, rubs or gallops. Peripheral pulses are normal and equal bilaterally without edema. No aortic or femoral bruits. Chest: symmetric with normal excursions and percussion. Breasts: Symmetric,  without lumps, nipple discharge, retractions, or fibrocystic changes.  Abdomen: Flat, soft, with bowel sounds. Nontender, no guarding, rebound, hernias, masses, or organomegaly.  Lymphatics: Non tender without lymphadenopathy.  Genitourinary:  Musculoskeletal: Full ROM all peripheral extremities, joint stability, 5/5 strength, and normal gait. Skin: Warm and dry without rashes, lesions, cyanosis, clubbing or  ecchymosis.  Neuro: Cranial nerves intact, reflexes equal bilaterally. Normal muscle tone, no cerebellar symptoms. Sensation intact.  Pysch: Awake and oriented X 3, normal affect, Insight and Judgment appropriate.   Assessment and Plan  1. Essential hypertension  - Microalbumin / creatinine urine ratio - EKG 12-Lead - TSH  2. Hyperlipidemia  - Lipid panel  3. Prediabetes  - Hemoglobin A1c - Insulin, random  4. Vitamin D deficiency  - Vit D  25 hydroxy   5. Hypothyroidism   6. Screening for rectal cancer  - POC Hemoccult Bld/Stl   7. Other fatigue  - Vitamin B12 - Iron and TIBC - TSH  8. Medication management  - CBC with Differential/Platelet - BASIC METABOLIC PANEL WITH GFR - Hepatic function panel - Magnesium - Urinalysis, Routine w reflex microscopic  9. Morbid obesity (BMI 32.06)   10. BMI 32.0-32.9,adult   Continue prudent diet as discussed, weight control, BP monitoring, regular exercise, and medications. Discussed med's effects and SE's. Screening labs and  tests as requested with regular follow-up as recommended.  Over 40 minutes of exam, counseling, chart review was performed.

## 2015-08-23 ENCOUNTER — Ambulatory Visit (INDEPENDENT_AMBULATORY_CARE_PROVIDER_SITE_OTHER): Payer: BLUE CROSS/BLUE SHIELD | Admitting: Internal Medicine

## 2015-08-23 ENCOUNTER — Encounter: Payer: Self-pay | Admitting: Internal Medicine

## 2015-08-23 VITALS — BP 134/86 | HR 88 | Temp 98.1°F | Resp 16 | Ht 68.0 in | Wt 212.4 lb

## 2015-08-23 DIAGNOSIS — Z79899 Other long term (current) drug therapy: Secondary | ICD-10-CM | POA: Diagnosis not present

## 2015-08-23 DIAGNOSIS — I10 Essential (primary) hypertension: Secondary | ICD-10-CM | POA: Diagnosis not present

## 2015-08-23 DIAGNOSIS — E559 Vitamin D deficiency, unspecified: Secondary | ICD-10-CM | POA: Diagnosis not present

## 2015-08-23 DIAGNOSIS — R7309 Other abnormal glucose: Secondary | ICD-10-CM

## 2015-08-23 DIAGNOSIS — R5383 Other fatigue: Secondary | ICD-10-CM

## 2015-08-23 DIAGNOSIS — Z0000 Encounter for general adult medical examination without abnormal findings: Secondary | ICD-10-CM

## 2015-08-23 DIAGNOSIS — Z111 Encounter for screening for respiratory tuberculosis: Secondary | ICD-10-CM

## 2015-08-23 DIAGNOSIS — Z1212 Encounter for screening for malignant neoplasm of rectum: Secondary | ICD-10-CM

## 2015-08-23 DIAGNOSIS — E785 Hyperlipidemia, unspecified: Secondary | ICD-10-CM

## 2015-08-23 DIAGNOSIS — Z6832 Body mass index (BMI) 32.0-32.9, adult: Secondary | ICD-10-CM

## 2015-08-23 DIAGNOSIS — R7303 Prediabetes: Secondary | ICD-10-CM

## 2015-08-23 DIAGNOSIS — E039 Hypothyroidism, unspecified: Secondary | ICD-10-CM

## 2015-08-23 LAB — HEPATIC FUNCTION PANEL
ALBUMIN: 4.1 g/dL (ref 3.6–5.1)
ALK PHOS: 63 U/L (ref 33–115)
ALT: 47 U/L — ABNORMAL HIGH (ref 6–29)
AST: 40 U/L — AB (ref 10–30)
BILIRUBIN DIRECT: 0.1 mg/dL (ref <=0.2)
BILIRUBIN INDIRECT: 0.5 mg/dL (ref 0.2–1.2)
BILIRUBIN TOTAL: 0.6 mg/dL (ref 0.2–1.2)
Total Protein: 7.2 g/dL (ref 6.1–8.1)

## 2015-08-23 LAB — CBC WITH DIFFERENTIAL/PLATELET
BASOS ABS: 0.1 10*3/uL (ref 0.0–0.1)
BASOS PCT: 1 % (ref 0–1)
EOS ABS: 0.3 10*3/uL (ref 0.0–0.7)
EOS PCT: 4 % (ref 0–5)
HCT: 44.8 % (ref 36.0–46.0)
Hemoglobin: 14.9 g/dL (ref 12.0–15.0)
LYMPHS ABS: 2 10*3/uL (ref 0.7–4.0)
Lymphocytes Relative: 24 % (ref 12–46)
MCH: 30.5 pg (ref 26.0–34.0)
MCHC: 33.3 g/dL (ref 30.0–36.0)
MCV: 91.6 fL (ref 78.0–100.0)
MPV: 10.4 fL (ref 8.6–12.4)
Monocytes Absolute: 0.6 10*3/uL (ref 0.1–1.0)
Monocytes Relative: 7 % (ref 3–12)
Neutro Abs: 5.4 10*3/uL (ref 1.7–7.7)
Neutrophils Relative %: 64 % (ref 43–77)
PLATELETS: 284 10*3/uL (ref 150–400)
RBC: 4.89 MIL/uL (ref 3.87–5.11)
RDW: 13.5 % (ref 11.5–15.5)
WBC: 8.5 10*3/uL (ref 4.0–10.5)

## 2015-08-23 LAB — BASIC METABOLIC PANEL WITH GFR
BUN: 11 mg/dL (ref 7–25)
CHLORIDE: 104 mmol/L (ref 98–110)
CO2: 27 mmol/L (ref 20–31)
Calcium: 9.1 mg/dL (ref 8.6–10.2)
Creat: 0.83 mg/dL (ref 0.50–1.10)
GFR, EST NON AFRICAN AMERICAN: 88 mL/min (ref >=60)
Glucose, Bld: 82 mg/dL (ref 65–99)
POTASSIUM: 3.8 mmol/L (ref 3.5–5.3)
SODIUM: 137 mmol/L (ref 135–146)

## 2015-08-23 LAB — IRON AND TIBC
%SAT: 19 % (ref 11–50)
IRON: 70 ug/dL (ref 40–190)
TIBC: 374 ug/dL (ref 250–450)
UIBC: 304 ug/dL (ref 125–400)

## 2015-08-23 LAB — LIPID PANEL
CHOL/HDL RATIO: 6.6 ratio — AB (ref <=5.0)
Cholesterol: 284 mg/dL — ABNORMAL HIGH (ref 125–200)
HDL: 43 mg/dL — AB (ref >=46)
LDL CALC: 210 mg/dL — AB (ref <130)
TRIGLYCERIDES: 157 mg/dL — AB (ref <150)
VLDL: 31 mg/dL — ABNORMAL HIGH (ref <30)

## 2015-08-23 LAB — HEMOGLOBIN A1C
Hgb A1c MFr Bld: 5.6 % (ref <5.7)
Mean Plasma Glucose: 114 mg/dL (ref <117)

## 2015-08-23 LAB — MAGNESIUM: Magnesium: 2.1 mg/dL (ref 1.5–2.5)

## 2015-08-23 LAB — VITAMIN B12: VITAMIN B 12: 452 pg/mL (ref 211–911)

## 2015-08-23 LAB — TSH: TSH: 88.737 u[IU]/mL — AB (ref 0.350–4.500)

## 2015-08-24 LAB — URINALYSIS, ROUTINE W REFLEX MICROSCOPIC
Bilirubin Urine: NEGATIVE
Glucose, UA: NEGATIVE
HGB URINE DIPSTICK: NEGATIVE
Ketones, ur: NEGATIVE
LEUKOCYTES UA: NEGATIVE
NITRITE: NEGATIVE
PH: 7 (ref 5.0–8.0)
PROTEIN: NEGATIVE
Specific Gravity, Urine: 1.022 (ref 1.001–1.035)

## 2015-08-24 LAB — MICROALBUMIN / CREATININE URINE RATIO
CREATININE, URINE: 176.8 mg/dL
MICROALB UR: 0.5 mg/dL (ref <2.0)
MICROALB/CREAT RATIO: 2.8 mg/g (ref 0.0–30.0)

## 2015-08-24 LAB — INSULIN, RANDOM: INSULIN: 4.7 u[IU]/mL (ref 2.0–19.6)

## 2015-08-24 LAB — VITAMIN D 25 HYDROXY (VIT D DEFICIENCY, FRACTURES): Vit D, 25-Hydroxy: 20 ng/mL — ABNORMAL LOW (ref 30–100)

## 2015-11-25 ENCOUNTER — Ambulatory Visit: Payer: Self-pay | Admitting: Internal Medicine

## 2016-02-27 ENCOUNTER — Ambulatory Visit (INDEPENDENT_AMBULATORY_CARE_PROVIDER_SITE_OTHER): Payer: BLUE CROSS/BLUE SHIELD | Admitting: Internal Medicine

## 2016-02-27 ENCOUNTER — Encounter: Payer: Self-pay | Admitting: Internal Medicine

## 2016-02-27 VITALS — BP 134/98 | HR 80 | Temp 97.5°F | Resp 16 | Ht 68.0 in | Wt 218.4 lb

## 2016-02-27 DIAGNOSIS — I10 Essential (primary) hypertension: Secondary | ICD-10-CM

## 2016-02-27 DIAGNOSIS — E785 Hyperlipidemia, unspecified: Secondary | ICD-10-CM | POA: Diagnosis not present

## 2016-02-27 DIAGNOSIS — E039 Hypothyroidism, unspecified: Secondary | ICD-10-CM

## 2016-02-27 DIAGNOSIS — Z79899 Other long term (current) drug therapy: Secondary | ICD-10-CM | POA: Diagnosis not present

## 2016-02-27 DIAGNOSIS — E559 Vitamin D deficiency, unspecified: Secondary | ICD-10-CM | POA: Diagnosis not present

## 2016-02-27 DIAGNOSIS — R7303 Prediabetes: Secondary | ICD-10-CM

## 2016-02-27 LAB — HEPATIC FUNCTION PANEL
ALBUMIN: 4 g/dL (ref 3.6–5.1)
ALK PHOS: 52 U/L (ref 33–115)
ALT: 83 U/L — AB (ref 6–29)
AST: 73 U/L — AB (ref 10–30)
BILIRUBIN DIRECT: 0.1 mg/dL (ref <=0.2)
BILIRUBIN TOTAL: 0.4 mg/dL (ref 0.2–1.2)
Indirect Bilirubin: 0.3 mg/dL (ref 0.2–1.2)
Total Protein: 6.7 g/dL (ref 6.1–8.1)

## 2016-02-27 LAB — CBC WITH DIFFERENTIAL/PLATELET
Basophils Absolute: 0.1 10*3/uL (ref 0.0–0.1)
Basophils Relative: 1 % (ref 0–1)
Eosinophils Absolute: 0.2 10*3/uL (ref 0.0–0.7)
Eosinophils Relative: 3 % (ref 0–5)
HEMATOCRIT: 41.2 % (ref 36.0–46.0)
HEMOGLOBIN: 13.8 g/dL (ref 12.0–15.0)
LYMPHS ABS: 2.7 10*3/uL (ref 0.7–4.0)
LYMPHS PCT: 34 % (ref 12–46)
MCH: 31.1 pg (ref 26.0–34.0)
MCHC: 33.5 g/dL (ref 30.0–36.0)
MCV: 92.8 fL (ref 78.0–100.0)
MONO ABS: 0.5 10*3/uL (ref 0.1–1.0)
MONOS PCT: 7 % (ref 3–12)
MPV: 10.3 fL (ref 8.6–12.4)
NEUTROS ABS: 4.3 10*3/uL (ref 1.7–7.7)
NEUTROS PCT: 55 % (ref 43–77)
Platelets: 268 10*3/uL (ref 150–400)
RBC: 4.44 MIL/uL (ref 3.87–5.11)
RDW: 14 % (ref 11.5–15.5)
WBC: 7.8 10*3/uL (ref 4.0–10.5)

## 2016-02-27 LAB — BASIC METABOLIC PANEL WITH GFR
BUN: 16 mg/dL (ref 7–25)
CALCIUM: 9.5 mg/dL (ref 8.6–10.2)
CHLORIDE: 103 mmol/L (ref 98–110)
CO2: 29 mmol/L (ref 20–31)
CREATININE: 0.98 mg/dL (ref 0.50–1.10)
GFR, Est African American: 83 mL/min (ref >=60)
GFR, Est Non African American: 72 mL/min (ref >=60)
GLUCOSE: 88 mg/dL (ref 65–99)
Potassium: 4.2 mmol/L (ref 3.5–5.3)
Sodium: 139 mmol/L (ref 135–146)

## 2016-02-27 LAB — HEMOGLOBIN A1C
HEMOGLOBIN A1C: 5.8 % — AB (ref <5.7)
MEAN PLASMA GLUCOSE: 120 mg/dL — AB (ref <117)

## 2016-02-27 LAB — LIPID PANEL
CHOL/HDL RATIO: 7.6 ratio — AB (ref <=5.0)
CHOLESTEROL: 374 mg/dL — AB (ref 125–200)
HDL: 49 mg/dL (ref >=46)
LDL Cholesterol: 292 mg/dL — ABNORMAL HIGH (ref <130)
Triglycerides: 163 mg/dL — ABNORMAL HIGH (ref <150)
VLDL: 33 mg/dL — AB (ref <30)

## 2016-02-27 LAB — TSH: TSH: 108.17 m[IU]/L — AB

## 2016-02-27 LAB — MAGNESIUM: Magnesium: 2 mg/dL (ref 1.5–2.5)

## 2016-02-27 MED ORDER — BISOPROLOL-HYDROCHLOROTHIAZIDE 10-6.25 MG PO TABS: ORAL_TABLET | ORAL | Status: DC

## 2016-02-27 MED ORDER — LEVOTHYROXINE SODIUM 200 MCG PO TABS: 200.0000 ug | ORAL_TABLET | Freq: Every day | ORAL | Status: DC

## 2016-02-27 NOTE — Progress Notes (Signed)
Patient ID: Tammy Gibson, female   DOB: 1976/02/27, 40 y.o.   MRN: [id number]   This very nice 40 y.o. MWF presents for  follow up with Hypertension, Hyperlipidemia, Pre-Diabetes and Vitamin D Deficiency.    Patient is treated for HTN & BP has been controlled at home. Today's BP was 134/98 and later rechecked at 134/84. Patient has had no complaints of any cardiac type chest pain, palpitations, dyspnea/orthopnea/PND, dizziness, claudication, or dependent edema.   Hyperlipidemia is not controlled with diet & meds. Patient denies myalgias or other med SE's. Last Lipids were not at goal with  Cholesterol 284*; HDL 43*; LDL 210*; Triglycerides 157 on 08/23/2015.    Also, the patient has history of Morbid Obesity (BMI 33+) and consequent PreDiabetes and has had no symptoms of reactive hypoglycemia, diabetic polys, paresthesias or visual blurring.  Last A1c was  5.6% on 08/23/2015.    Further, the patient also has history of Vitamin D Deficiency of "28" in 2014 and supplements vitamin D sporadically. Last vitamin D was still very low at 20 on 08/23/2015.  Medication Sig  . bisoprolol-hctz [facility]) 5-6.25  Take 1 tablet by mouth daily.  Marland Kitchen VITAMIN D 16109 UNITS  Take 10,000 Units by mouth daily.  Marland Kitchen dicyclomine 20 MG tablet Take 1 tablet (20 mg total) by mouth 4 (four) times daily -  before meals and at bedtime.  Marland Kitchen levothyroxine 200 MCG tablet Take 1 tablet (200 mcg total) by mouth daily.  . rosuvastatin10 MG tablet Take 10 mg by mouth daily.  . traMADol  50 MG tablet Take 1 tablet (50 mg total) by mouth every 6 (six) hours as needed.   No Known Allergies  PMHx:   Past Medical History  Diagnosis Date  . Hyperlipidemia   . Abdominal pain   . Endometriosis   . Vitamin D deficiency   . Thyroid disease     pt was hypo now hyperthyroid  . Bell's palsy   . Adenomyosis   . Dysmenorrhea   . Dyspareunia   . Infertility, female   . Urinary incontinence    Immunization History  Administered Date(s)  Administered  . Hepatitis B 01/11/2011  . Influenza-Unspecified 10/11/2013  . PPD Test 08/17/2014, 08/23/2015  . Td 12/17/2008   Past Surgical History  Procedure Laterality Date  . Ectopic pregnancy surgery  1997    RIGHT TUBE  . Salpingectomy Right   . Iud removal  2013    mirena,only in for 6 months   FHx:    Reviewed / unchanged  SHx:    Reviewed / unchanged  Systems Review:  Constitutional: Denies fever, chills, wt changes, headaches, insomnia, fatigue, night sweats, change in appetite. Eyes: Denies redness, blurred vision, diplopia, discharge, itchy, watery eyes.  ENT: Denies discharge, congestion, post nasal drip, epistaxis, sore throat, earache, hearing loss, dental pain, tinnitus, vertigo, sinus pain, snoring.  CV: Denies chest pain, palpitations, irregular heartbeat, syncope, dyspnea, diaphoresis, orthopnea, PND, claudication or edema. Respiratory: denies cough, dyspnea, DOE, pleurisy, hoarseness, laryngitis, wheezing.  Gastrointestinal: Denies dysphagia, odynophagia, heartburn, reflux, water brash, abdominal pain or cramps, nausea, vomiting, bloating, diarrhea, constipation, hematemesis, melena, hematochezia  or hemorrhoids. Genitourinary: Denies dysuria, frequency, urgency, nocturia, hesitancy, discharge, hematuria or flank pain. Musculoskeletal: Denies arthralgias, myalgias, stiffness, jt. swelling, pain, limping or strain/sprain.  Skin: Denies pruritus, rash, hives, warts, acne, eczema or change in skin lesion(s). Neuro: No weakness, tremor, incoordination, spasms, paresthesia or pain. Psychiatric: Denies confusion, memory loss or sensory loss. Endo: Denies change in  weight, skin or hair change.  Heme/Lymph: No excessive bleeding, bruising or enlarged lymph nodes.  Physical Exam  BP 134/98 mmHg  Pulse 80  Temp(Src) 97.5 F (36.4 C)  Resp 16  Ht 5\' 8"  (1.727 m)  Wt 218 lb 6.4 oz (99.066 kg)  BMI 33.22 kg/m2  Appears well nourished and in no distress. Eyes:  PERRLA, EOMs, conjunctiva no swelling or erythema. Sinuses: No frontal/maxillary tenderness ENT/Mouth: EAC's clear, TM's nl w/o erythema, bulging. Nares clear w/o erythema, swelling, exudates. Oropharynx clear without erythema or exudates. Oral hygiene is good. Tongue normal, non obstructing. Hearing intact.  Neck: Supple. Thyroid nl. Car 2+/2+ without bruits, nodes or JVD. Chest: Respirations nl with BS clear & equal w/o rales, rhonchi, wheezing or stridor.  Cor: Heart sounds normal w/ regular rate and rhythm without sig. murmurs, gallops, clicks, or rubs. Peripheral pulses normal and equal  without edema.  Abdomen: Soft & bowel sounds normal. Non-tender w/o guarding, rebound, hernias, masses, or organomegaly.  Lymphatics: Unremarkable.  Musculoskeletal: Full ROM all peripheral extremities, joint stability, 5/5 strength, and normal gait.  Skin: Warm, dry without exposed rashes, lesions or ecchymosis apparent.  Neuro: Cranial nerves intact, reflexes equal bilaterally. Sensory-motor testing grossly intact. Tendon reflexes grossly intact.  Pysch: Alert & oriented x 3.  Insight and judgement nl & appropriate. No ideations.  Assessment and Plan:  1. Essential hypertension  - TSH - bisoprolol-hctz (ZIAC) 10-6.25 MG tablet; Take 1 tablet daily for BP  Dispense: 90 tablet; Refill: 0  2. Hyperlipidemia  - Lipid panel - TSH  3. Prediabetes  - Hemoglobin A1c - Insulin, random  4. Vitamin D deficiency  - VITAMIN D 25 Hydroxy   5. Hypothyroidism  - levothyroxine (SYNTHROID) 200 MCG tablet; Take 1 tablet (200 mcg total) by mouth daily.  Dispense: 90 tablet; Refill: 0  6. Morbid obesity, unspecified obesity type ([place])   7. Medication management  - CBC with Differential/Platelet - BASIC METABOLIC PANEL WITH GFR - Magnesium - Hepatic function panel   Recommended regular exercise, BP monitoring, weight control, and discussed med and SE's. Recommended labs to assess and monitor clinical  status. Further disposition pending results of labs. Over 30 minutes of exam, counseling, chart review was performed

## 2016-02-27 NOTE — Patient Instructions (Signed)

## 2016-02-28 ENCOUNTER — Other Ambulatory Visit: Payer: Self-pay | Admitting: Internal Medicine

## 2016-02-28 DIAGNOSIS — R7989 Other specified abnormal findings of blood chemistry: Secondary | ICD-10-CM

## 2016-02-28 DIAGNOSIS — R945 Abnormal results of liver function studies: Secondary | ICD-10-CM

## 2016-02-28 LAB — VITAMIN D 25 HYDROXY (VIT D DEFICIENCY, FRACTURES): VIT D 25 HYDROXY: 18 ng/mL — AB (ref 30–100)

## 2016-02-28 LAB — INSULIN, RANDOM: Insulin: 10.7 u[IU]/mL (ref 2.0–19.6)

## 2016-03-02 ENCOUNTER — Ambulatory Visit (HOSPITAL_COMMUNITY)
Admission: RE | Admit: 2016-03-02 | Discharge: 2016-03-02 | Disposition: A | Discharge status: Home / Self Care | Payer: BLUE CROSS/BLUE SHIELD | Source: Ambulatory Visit | Attending: Internal Medicine | Admitting: Internal Medicine

## 2016-03-02 DIAGNOSIS — K760 Fatty (change of) liver, not elsewhere classified: Secondary | ICD-10-CM | POA: Diagnosis not present

## 2016-03-02 DIAGNOSIS — R7989 Other specified abnormal findings of blood chemistry: Secondary | ICD-10-CM | POA: Insufficient documentation

## 2016-03-02 DIAGNOSIS — R945 Abnormal results of liver function studies: Secondary | ICD-10-CM

## 2016-03-05 ENCOUNTER — Ambulatory Visit: Payer: Self-pay

## 2016-04-23 IMAGING — US US ABDOMEN COMPLETE
1 series · 14 of 25 positions shown · non-contrast
Comparison: Abdominal ultrasound 01/07/2014. CT abdomen and pelvis
09/19/2013.

CLINICAL DATA: Abnormal liver function tests.

EXAM:
ABDOMEN ULTRASOUND COMPLETE

[Series 1: us abdomen complete · 0.23mm/px · 14 of 110 slices shown]
[im 1/110]
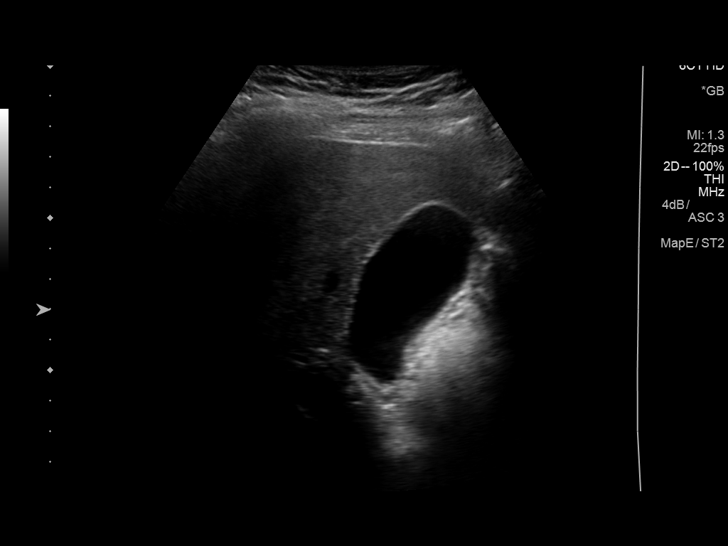
[im 10/110]
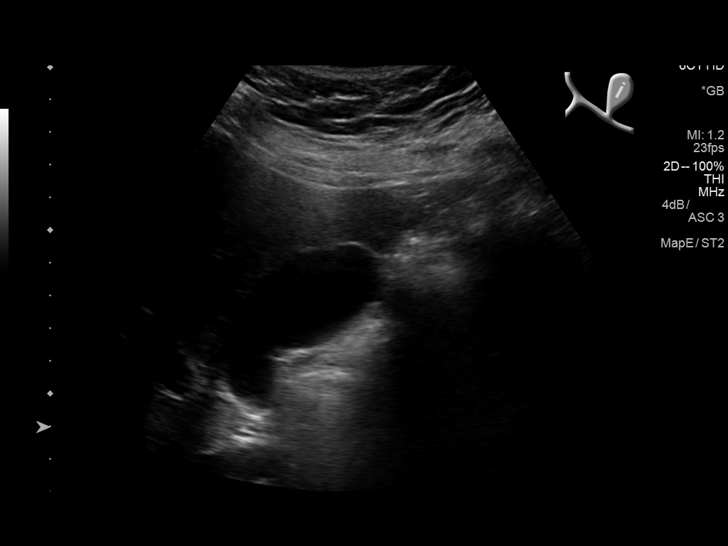
[im 19/110]
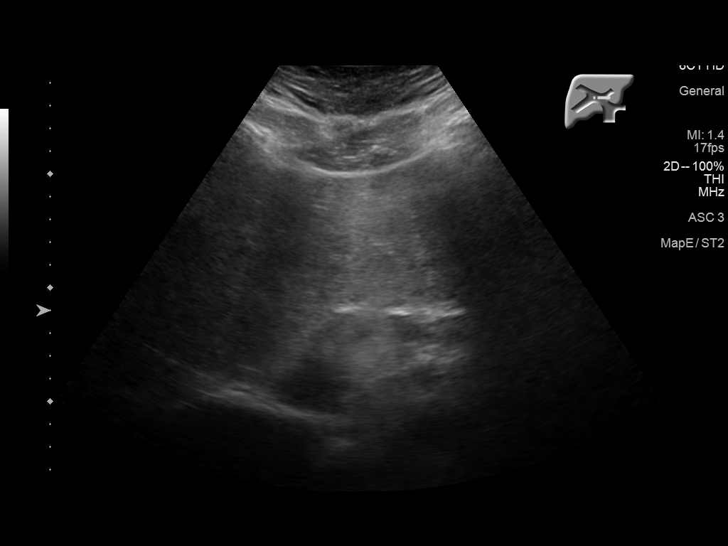
[im 28/110]
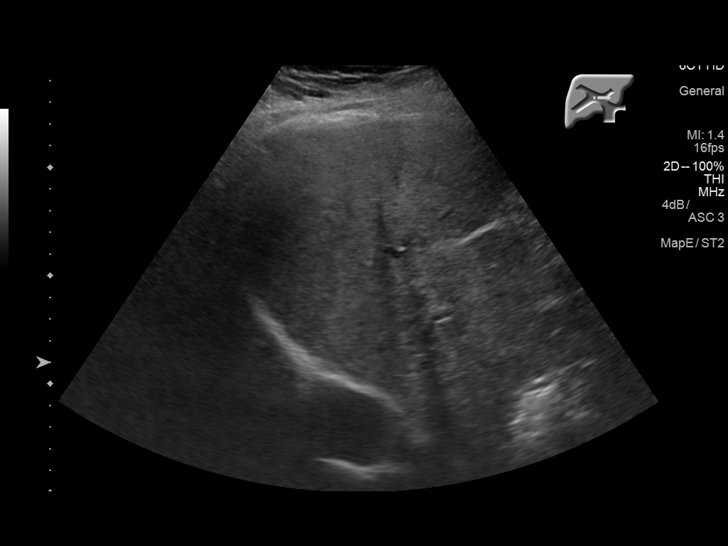
[im 37/110]
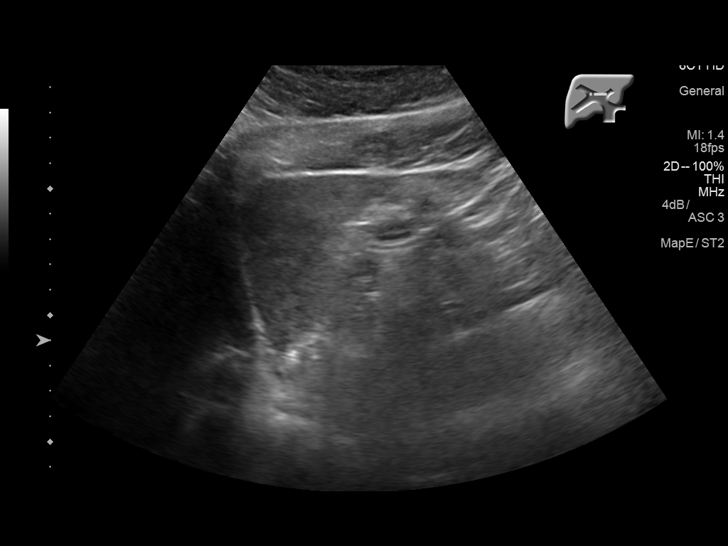
[im 41/110]
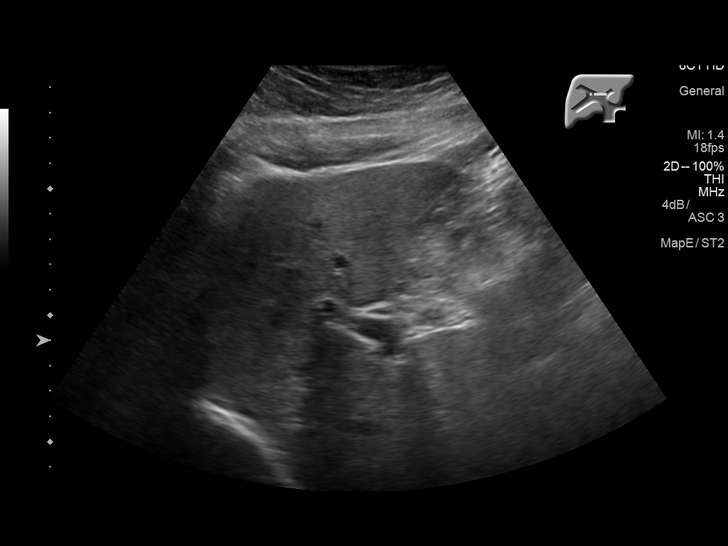
[im 50/110]
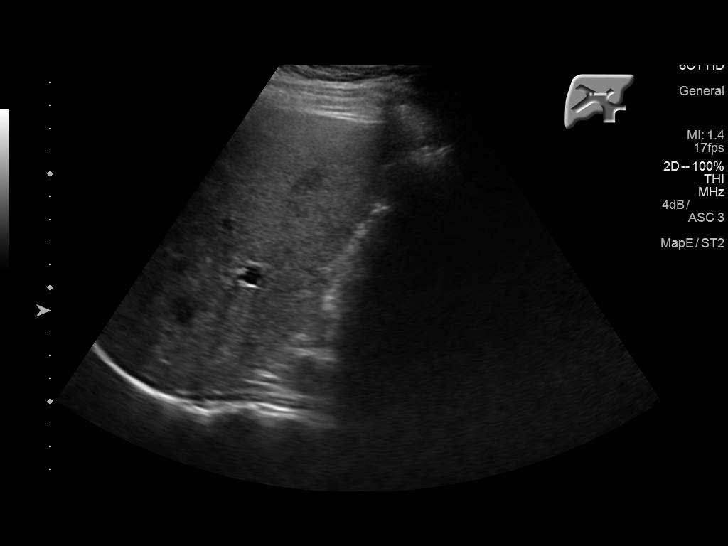
[im 60/110]
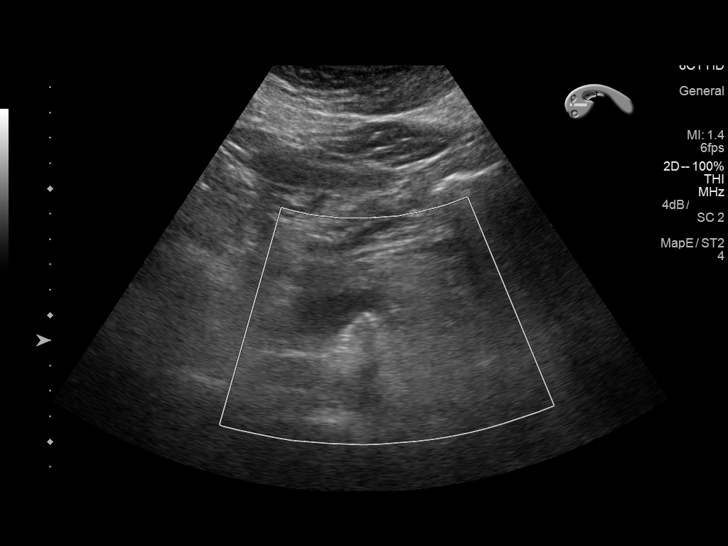
[im 69/110]
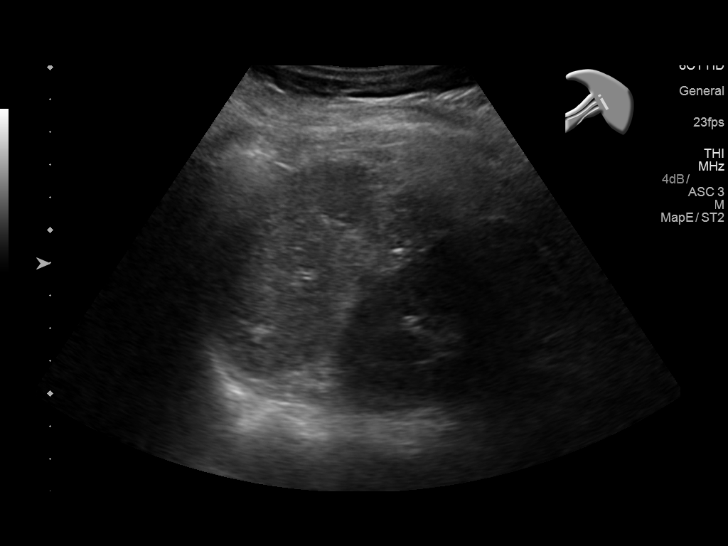
[im 73/110]
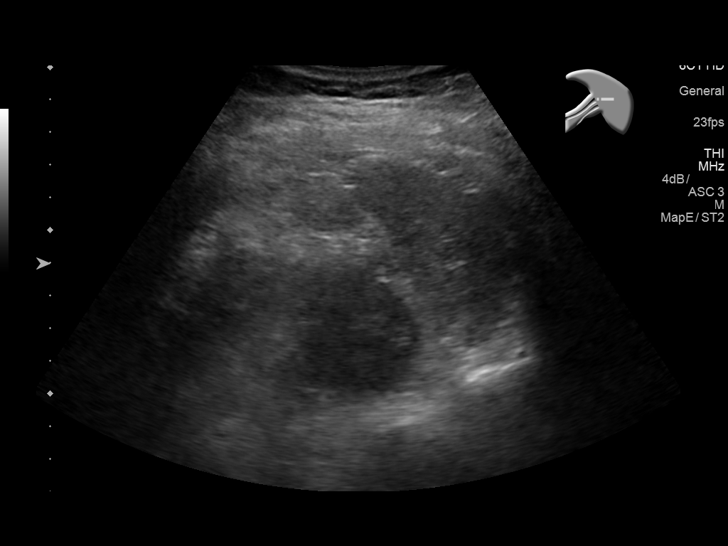
[im 82/110]
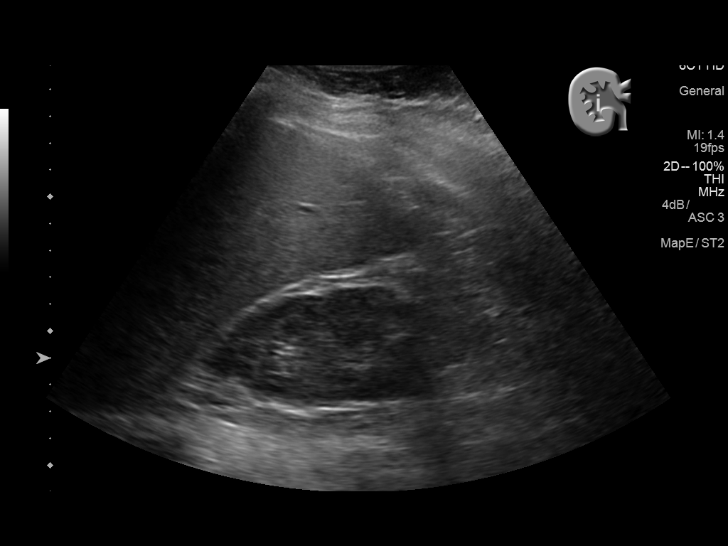
[im 91/110]
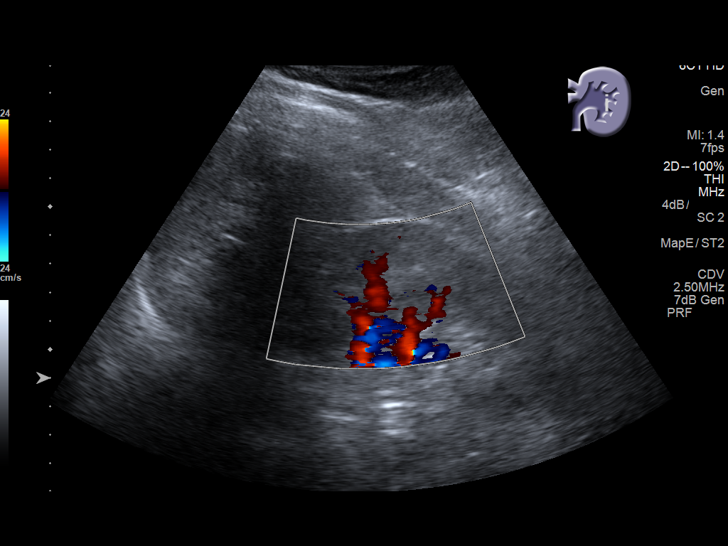
[im 100/110]
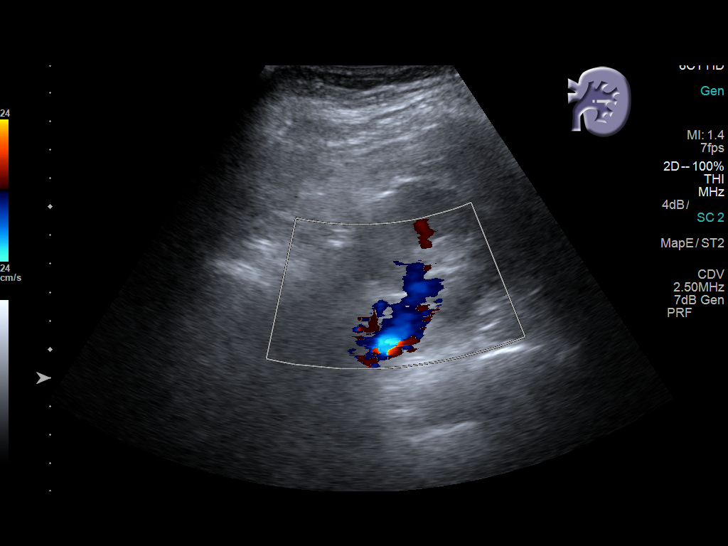
[im 110/110]
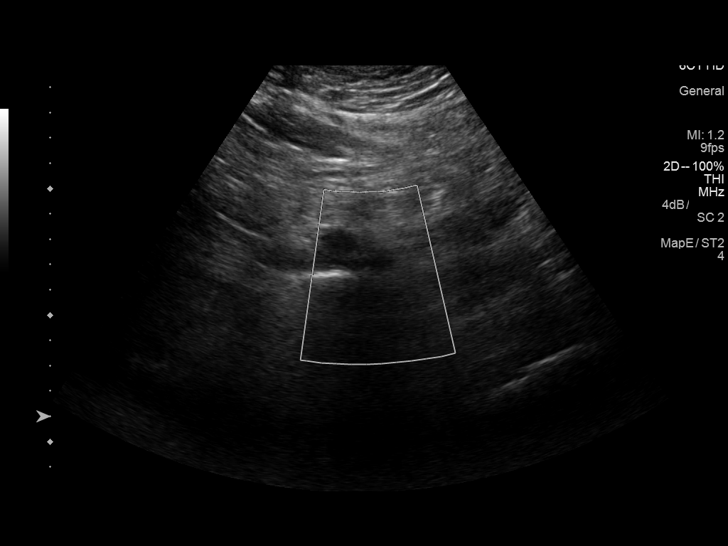

[14 of 25 positions shown; findings below may reference images not displayed]

FINDINGS: Gallbladder: No gallstones or wall thickening visualized. No
sonographic Murphy sign noted by sonographer.

Common bile duct: Diameter: 0.2 cm

Liver: The liver demonstrates increased echogenicity. No focal
lesion or intrahepatic biliary ductal dilatation.

IVC: No abnormality visualized.

Pancreas: Visualized portion unremarkable.

Spleen: Size and appearance within normal limits.

Right Kidney: Length: 12.4 cm. Echogenicity within normal limits. No
mass or hydronephrosis visualized.

Left Kidney: Length: 12.2 cm. Echogenicity within normal limits. No
mass or hydronephrosis visualized.

Abdominal aorta: No aneurysm visualized.

Other findings: None.
IMPRESSION: Fatty infiltration of the liver.  The study is otherwise negative.

## 2016-05-01 ENCOUNTER — Ambulatory Visit: Payer: BLUE CROSS/BLUE SHIELD | Admitting: Internal Medicine

## 2016-05-01 ENCOUNTER — Telehealth: Payer: Self-pay | Admitting: *Deleted

## 2016-05-01 NOTE — Telephone Encounter (Signed)
No show letter sent to patient

## 2016-05-31 ENCOUNTER — Ambulatory Visit: Payer: Self-pay | Admitting: Physician Assistant

## 2016-09-16 NOTE — Patient Instructions (Signed)

## 2016-09-16 NOTE — Progress Notes (Signed)
[facility] Unk Pinto, M.D.    Uvaldo Bristle. Silverio Lay, P.A.-C      Starlyn Skeans, P.A.-C  [facility]                [address] [place] [id number] Telephone [phone] Telefax [phone]  Annual Screening/Preventative Visit & Comprehensive Evaluation &  Examination     This very nice 40 y.o.female presents for a Screening/Preventative Visit & comprehensive evaluation and management of multiple medical co-morbidities.  Patient has been followed for HTN, Prediabetes, Hyperlipidemia and Vitamin D Deficiency.      Patient has hx/o Labile HTN predating from 2012 and in March 2017 , BP was 134/98 rechecked at 134/86.  Marland Kitchen Patient's BP has been controlled at home and patient denies any cardiac symptoms as chest pain, palpitations, shortness of breath, dizziness or ankle swelling. Today's BP is 146/98 - confirmed x 2.       Patient's hyperlipidemia is not controlled with diet and medications. Patient denies myalgias or other medication SE's. Last lipids were not at goal: Lab Results  Component Value Date   CHOL 374 (H) 02/27/2016   HDL 49 02/27/2016   LDLCALC 292 (H) 02/27/2016   TRIG 163 (H) 02/27/2016   CHOLHDL 7.6 (H) 02/27/2016      Patient has Morbid Obesity (BMI 33+) and consequent prediabetes predating since Dec 2014 with A1c 5.8% and then 6.1% in May 2016. Patient denies reactive hypoglycemic symptoms, visual blurring, diabetic polys, or paresthesias. Last A1c was not at goal: Lab Results  Component Value Date   HGBA1C 5.8 (H) 02/27/2016      Finally, patient has history of Vitamin D Deficiency ("28" in 2014). {Patient has not supplemented as directed and last Vitamin D was even lower: Lab Results  Component Value Date   VD25OH 18 (L) 02/27/2016   Current Outpatient Prescriptions on File Prior to Visit  Medication Sig  . Cholecalciferol (VITAMIN D3) 10000 UNITS capsule Take 10,000  Units by mouth daily.  Marland Kitchen levothyroxine (SYNTHROID) 200 MCG tablet Take 1 tablet (200 mcg total) by mouth daily.  . naproxen sodium (ANAPROX) 550 MG tablet Take 550 mg by mouth 2 (two) times daily with a meal.  . rosuvastatin (CRESTOR) 10 MG tablet Take 10 mg by mouth daily.  . bisoprolol-hydrochlorothiazide (ZIAC) 10-6.25 MG tablet Take 1 tablet daily for BP  . dicyclomine (BENTYL) 20 MG tablet Take 1 tablet (20 mg total) by mouth 4 (four) times daily -  before meals and at bedtime.   No current facility-administered medications on file prior to visit.    No Known Allergies Past Medical History:  Diagnosis Date  . Abdominal pain   . Adenomyosis   . Bell's palsy   . Dysmenorrhea   . Dyspareunia   . Endometriosis   . Hyperlipidemia   . Infertility, female   . Thyroid disease    pt was hypo now hyperthyroid  . Urinary incontinence   . Vitamin D deficiency    Health Maintenance  Topic Date Due  . INFLUENZA VACCINE  07/17/2016  . PAP SMEAR  01/11/2017  . TETANUS/TDAP  12/17/2018  . HIV Screening  Completed   Immunization History  Administered Date(s) Administered  . Hepatitis B 01/11/2011  . Influenza-Unspecified 10/11/2013  . PPD Test 08/17/2014, 08/23/2015, 09/17/2016  . Td 12/17/2008   Past Surgical History:  Procedure Laterality Date  .  ECTOPIC PREGNANCY SURGERY  1997   RIGHT TUBE  . IUD REMOVAL  2013   mirena,only in for 6 months  . SALPINGECTOMY Right    Family History  Problem Relation Age of Onset  . Hypertension Father   . Hyperlipidemia Father   . Diabetes Father   . Thyroid disease Father   . Thyroid disease Sister   . Thyroid disease Mother   . Breast cancer Maternal Aunt   . Cancer - Colon Paternal Grandfather   . Thyroid disease Paternal Grandmother    Social History  Substance Use Topics  . Smoking status: Current Every Day Smoker    Packs/day: 0.50    Types: Cigarettes  . Smokeless tobacco: Never Used     Comment: in the process of quitting.  smokes 8-12 per day  . Alcohol use No     Comment: Patient stopped alcohol due to elevated liver enzymes.    ROS Constitutional: Denies fever, chills, weight loss/gain, headaches, insomnia,  night sweats, and change in appetite. Does c/o fatigue. Eyes: Denies redness, blurred vision, diplopia, discharge, itchy, watery eyes.  ENT: Denies discharge, congestion, post nasal drip, epistaxis, sore throat, earache, hearing loss, dental pain, Tinnitus, Vertigo, Sinus pain, snoring.  Cardio: Denies chest pain, palpitations, irregular heartbeat, syncope, dyspnea, diaphoresis, orthopnea, PND, claudication, edema Respiratory: denies cough, dyspnea, DOE, pleurisy, hoarseness, laryngitis, wheezing.  Gastrointestinal: Denies dysphagia, heartburn, reflux, water brash, pain, cramps, nausea, vomiting, bloating, diarrhea, constipation, hematemesis, melena, hematochezia, jaundice, hemorrhoids Genitourinary: Denies dysuria, frequency, urgency, nocturia, hesitancy, discharge, hematuria, flank pain Breast: Breast lumps, nipple discharge, bleeding.  Musculoskeletal: Denies arthralgia, myalgia, stiffness, Jt. Swelling, pain, limp, and strain/sprain. Denies falls. Skin: Denies puritis, rash, hives, warts, acne, eczema, changing in skin lesion Neuro: No weakness, tremor, incoordination, spasms, paresthesia, pain Psychiatric: Denies confusion, memory loss, sensory loss. Denies Depression. Endocrine: Denies change in weight, skin, hair change, nocturia, and paresthesia, diabetic polys, visual blurring, hyper / hypo glycemic episodes.  Heme/Lymph: No excessive bleeding, bruising, enlarged lymph nodes.  Physical Exam  BP (!) 146/98   Pulse 80   Temp 97.3 F (36.3 C)   Resp 16   Ht 5\' 8"  (1.727 m)   Wt 222 lb (100.7 kg)   BMI 33.75 kg/m   General Appearance: Well nourished and in no apparent distress.  Eyes: PERRLA, EOMs, conjunctiva no swelling or erythema, normal fundi and vessels. Sinuses: No frontal/maxillary  tenderness ENT/Mouth: EACs patent / TMs  nl. Nares clear without erythema, swelling, mucoid exudates. Oral hygiene is good. No erythema, swelling, or exudate. Tongue normal, non-obstructing. Tonsils not swollen or erythematous. Hearing normal.  Neck: Supple, thyroid normal. No bruits, nodes or JVD. Respiratory: Respiratory effort normal.  BS equal and clear bilateral without rales, rhonci, wheezing or stridor. Cardio: Heart sounds are normal with regular rate and rhythm and no murmurs, rubs or gallops. Peripheral pulses are normal and equal bilaterally without edema. No aortic or femoral bruits. Chest: symmetric with normal excursions and percussion. Breasts: Symmetric, without lumps, nipple discharge, retractions, or fibrocystic changes.  Abdomen: Flat, soft with bowel sounds active. Nontender, no guarding, rebound, hernias, masses, or organomegaly.  Lymphatics: Non tender without lymphadenopathy.  Genitourinary:  Musculoskeletal: Full ROM all peripheral extremities, joint stability, 5/5 strength, and normal gait. Skin: Warm and dry without rashes, lesions, cyanosis, clubbing or  ecchymosis.  Neuro: Cranial nerves intact, reflexes equal bilaterally. Normal muscle tone, no cerebellar symptoms. Sensation intact.  Pysch: Alert and oriented X 3, normal affect, Insight and Judgment appropriate.  Assessment and Plan  1. Annual Preventative Screening Examination  2. Essential hypertension  -Add Rx- Lisinopril 40 mg #90 x 1 rf - take 1/2 -1 tablet daily  - Microalbumin / creatinine urine ratio - EKG 12-Lead - TSH  3. Mixed hyperlipidemia  - Lipid panel - TSH  4. Prediabetes  - Hemoglobin A1c - Insulin, random  5. Vitamin D deficiency  - VITAMIN D 25 Hydroxy  6. Hypothyroidism  7. Morbid obesity ([place])  8. Screening for rectal cancer  - POC Hemoccult Bld/Stl   9. Screening examination for pulmonary tuberculosis   10. Medication management  - CBC with  Differential/Platelet - BASIC METABOLIC PANEL WITH GFR - Hepatic function panel - Magnesium  11. Screening for ischemic heart disease  - Urinalysis, Routine w reflex microscopic  12. Other fatigue  - Vitamin B12 - Iron and TIBC - CBC with Differential/Platelet - TSH      Continue prudent diet as discussed, weight control, BP monitoring, regular exercise, and medications. Discussed med's effects and SE's. Screening labs and tests as requested with regular follow-up as recommended. Over 40 minutes of exam, counseling, chart review and high complex critical decision making was performed.

## 2016-09-17 ENCOUNTER — Encounter: Payer: Self-pay | Admitting: Internal Medicine

## 2016-09-17 ENCOUNTER — Ambulatory Visit (INDEPENDENT_AMBULATORY_CARE_PROVIDER_SITE_OTHER): Payer: BLUE CROSS/BLUE SHIELD | Admitting: Internal Medicine

## 2016-09-17 VITALS — BP 146/98 | HR 80 | Temp 97.3°F | Resp 16 | Ht 68.0 in | Wt 222.0 lb

## 2016-09-17 DIAGNOSIS — E038 Other specified hypothyroidism: Secondary | ICD-10-CM

## 2016-09-17 DIAGNOSIS — Z79899 Other long term (current) drug therapy: Secondary | ICD-10-CM

## 2016-09-17 DIAGNOSIS — Z0001 Encounter for general adult medical examination with abnormal findings: Secondary | ICD-10-CM

## 2016-09-17 DIAGNOSIS — Z111 Encounter for screening for respiratory tuberculosis: Secondary | ICD-10-CM

## 2016-09-17 DIAGNOSIS — E782 Mixed hyperlipidemia: Secondary | ICD-10-CM

## 2016-09-17 DIAGNOSIS — E559 Vitamin D deficiency, unspecified: Secondary | ICD-10-CM

## 2016-09-17 DIAGNOSIS — R7303 Prediabetes: Secondary | ICD-10-CM

## 2016-09-17 DIAGNOSIS — I10 Essential (primary) hypertension: Secondary | ICD-10-CM | POA: Diagnosis not present

## 2016-09-17 DIAGNOSIS — E063 Autoimmune thyroiditis: Secondary | ICD-10-CM

## 2016-09-17 DIAGNOSIS — Z136 Encounter for screening for cardiovascular disorders: Secondary | ICD-10-CM

## 2016-09-17 DIAGNOSIS — R5383 Other fatigue: Secondary | ICD-10-CM

## 2016-09-17 DIAGNOSIS — Z0000 Encounter for general adult medical examination without abnormal findings: Secondary | ICD-10-CM | POA: Diagnosis not present

## 2016-09-17 DIAGNOSIS — Z1212 Encounter for screening for malignant neoplasm of rectum: Secondary | ICD-10-CM

## 2016-09-17 LAB — BASIC METABOLIC PANEL WITH GFR
BUN: 12 mg/dL (ref 7–25)
CHLORIDE: 105 mmol/L (ref 98–110)
CO2: 28 mmol/L (ref 20–31)
CREATININE: 1 mg/dL (ref 0.50–1.10)
Calcium: 9.3 mg/dL (ref 8.6–10.2)
GFR, Est African American: 81 mL/min (ref >=60)
GFR, Est Non African American: 70 mL/min (ref >=60)
GLUCOSE: 84 mg/dL (ref 65–99)
POTASSIUM: 4 mmol/L (ref 3.5–5.3)
Sodium: 139 mmol/L (ref 135–146)

## 2016-09-17 LAB — CBC WITH DIFFERENTIAL/PLATELET
BASOS ABS: 76 {cells}/uL (ref 0–200)
Basophils Relative: 1 %
EOS ABS: 304 {cells}/uL (ref 15–500)
Eosinophils Relative: 4 %
HEMATOCRIT: 41.1 % (ref 35.0–45.0)
HEMOGLOBIN: 13.3 g/dL (ref 11.7–15.5)
Lymphocytes Relative: 33 %
Lymphs Abs: 2508 cells/uL (ref 850–3900)
MCH: 29.4 pg (ref 27.0–33.0)
MCHC: 32.4 g/dL (ref 32.0–36.0)
MCV: 90.9 fL (ref 80.0–100.0)
MONO ABS: 532 {cells}/uL (ref 200–950)
MONOS PCT: 7 %
MPV: 10.9 fL (ref 7.5–12.5)
NEUTROS ABS: 4180 {cells}/uL (ref 1500–7800)
Neutrophils Relative %: 55 %
PLATELETS: 231 10*3/uL (ref 140–400)
RBC: 4.52 MIL/uL (ref 3.80–5.10)
RDW: 15 % (ref 11.0–15.0)
WBC: 7.6 10*3/uL (ref 3.8–10.8)

## 2016-09-17 LAB — LIPID PANEL
Cholesterol: 397 mg/dL — ABNORMAL HIGH (ref 125–200)
HDL: 43 mg/dL — ABNORMAL LOW (ref >=46)
LDL CALC: 329 mg/dL — AB (ref <130)
Total CHOL/HDL Ratio: 9.2 Ratio — ABNORMAL HIGH (ref <=5.0)
Triglycerides: 127 mg/dL (ref <150)
VLDL: 25 mg/dL (ref <30)

## 2016-09-17 LAB — VITAMIN B12: VITAMIN B 12: 405 pg/mL (ref 200–1100)

## 2016-09-17 LAB — IRON AND TIBC
%SAT: 22 % (ref 11–50)
Iron: 74 ug/dL (ref 40–190)
TIBC: 341 ug/dL (ref 250–450)
UIBC: 267 ug/dL (ref 125–400)

## 2016-09-17 LAB — HEPATIC FUNCTION PANEL
ALBUMIN: 4.2 g/dL (ref 3.6–5.1)
ALK PHOS: 50 U/L (ref 33–115)
ALT: 34 U/L — AB (ref 6–29)
AST: 40 U/L — AB (ref 10–30)
Bilirubin, Direct: 0.1 mg/dL (ref <=0.2)
Indirect Bilirubin: 0.4 mg/dL (ref 0.2–1.2)
TOTAL PROTEIN: 6.7 g/dL (ref 6.1–8.1)
Total Bilirubin: 0.5 mg/dL (ref 0.2–1.2)

## 2016-09-17 LAB — MAGNESIUM: MAGNESIUM: 2 mg/dL (ref 1.5–2.5)

## 2016-09-17 LAB — TSH: TSH: 93.84 m[IU]/L — AB

## 2016-09-17 MED ORDER — LISINOPRIL 40 MG PO TABS: ORAL_TABLET | ORAL | 1 refills | Status: DC

## 2016-09-18 ENCOUNTER — Other Ambulatory Visit: Payer: Self-pay | Admitting: Internal Medicine

## 2016-09-18 LAB — URINALYSIS, ROUTINE W REFLEX MICROSCOPIC
BILIRUBIN URINE: NEGATIVE
Glucose, UA: NEGATIVE
HGB URINE DIPSTICK: NEGATIVE
Ketones, ur: NEGATIVE
LEUKOCYTES UA: NEGATIVE
Nitrite: NEGATIVE
PROTEIN: NEGATIVE
Specific Gravity, Urine: 1.018 (ref 1.001–1.035)
pH: 6.5 (ref 5.0–8.0)

## 2016-09-18 LAB — MICROALBUMIN / CREATININE URINE RATIO
Creatinine, Urine: 118 mg/dL (ref 20–320)
MICROALB/CREAT RATIO: 3 ug/mg{creat} (ref <30)
Microalb, Ur: 0.4 mg/dL

## 2016-09-18 LAB — HEMOGLOBIN A1C
HEMOGLOBIN A1C: 5.5 % (ref <5.7)
Mean Plasma Glucose: 111 mg/dL

## 2016-09-18 LAB — VITAMIN D 25 HYDROXY (VIT D DEFICIENCY, FRACTURES): VIT D 25 HYDROXY: 21 ng/mL — AB (ref 30–100)

## 2016-09-18 LAB — INSULIN, RANDOM: INSULIN: 7.6 u[IU]/mL (ref 2.0–19.6)

## 2016-09-18 MED ORDER — ROSUVASTATIN CALCIUM 40 MG PO TABS: ORAL_TABLET | ORAL | 1 refills | Status: DC

## 2016-09-19 ENCOUNTER — Other Ambulatory Visit: Payer: Self-pay | Admitting: *Deleted

## 2016-09-19 ENCOUNTER — Other Ambulatory Visit: Payer: Self-pay | Admitting: Internal Medicine

## 2016-09-19 DIAGNOSIS — E039 Hypothyroidism, unspecified: Secondary | ICD-10-CM

## 2016-09-19 MED ORDER — LEVOTHYROXINE SODIUM 200 MCG PO TABS: 200.0000 ug | ORAL_TABLET | Freq: Every day | ORAL | 1 refills | Status: DC

## 2016-12-20 ENCOUNTER — Ambulatory Visit: Payer: Self-pay | Admitting: Internal Medicine

## 2017-03-25 NOTE — Patient Instructions (Signed)

## 2017-03-25 NOTE — Progress Notes (Signed)
This very nice 41 y.o. MWF presents for 3 month follow up with Hypertension, Hyperlipidemia, Pre-Diabetes and Vitamin D Deficiency. Patient relates that about a month ago ,as she felt "bad" that she stopped all of her meds including Ziac, Lisinopril, Bentyl prn, Thyroxine, Anaprox prn and Crestor 20 mg due to weakness & fatigue.      Patient is treated for HTN (2012) & BP has been controlled at home. Today's BP  off of Ziac & Lisinopril is at [place] - 118/80. Patient has had no complaints of any cardiac type chest pain, palpitations, dyspnea/orthopnea/PND, dizziness, claudication, or dependent edema.     Hyperlipidemia is controlled with diet & meds. Patient denies myalgias or other med SE's. Last Lipids were not at goal off of meds. Lab Results  Component Value Date   CHOL 397 (H) 09/17/2016   HDL 43 (L) 09/17/2016   LDLCALC 329 (H) 09/17/2016   TRIG 127 09/17/2016   CHOLHDL 9.2 (H) 09/17/2016        Also, the patient has history of Morbid Obesity (BMI 33+) / PreDiabetes (A1c 5.8% in 2014 and 6.1% in May 2016)  and she has had no symptoms of reactive hypoglycemia, diabetic polys, paresthesias or visual blurring.  Last A1c was at goal. Lab Results  Component Value Date   HGBA1C 5.5 09/17/2016      Further, the patient also has history of Vitamin D Deficiency ("28" in 2014) and supplements vitamin D without any suspected side-effects. Last vitamin D was still low:   Lab Results  Component Value Date   VD25OH 21 (L) 09/17/2016   Current Outpatient Prescriptions on File Prior to Visit  Medication Sig  . bisoprolol-hydrochlorothiazide (ZIAC) 10-6.25 MG tablet Take 1 tablet daily for BP  . Cholecalciferol (VITAMIN D3) 10000 UNITS capsule Take 10,000 Units by mouth daily.  Marland Kitchen dicyclomine (BENTYL) 20 MG tablet Take 1 tablet (20 mg total) by mouth 4 (four) times daily -  before meals and at bedtime.  Marland Kitchen levothyroxine (SYNTHROID) 200 MCG tablet Take 1 tablet (200 mcg total) by mouth daily.  (Patient not taking: Reported on 03/26/2017)  . lisinopril (PRINIVIL,ZESTRIL) 40 MG tablet Take 1/2 to 1 tablet daily for BP  . naproxen sodium (ANAPROX) 550 MG tablet Take 550 mg by mouth 2 (two) times daily with a meal.  . rosuvastatin (CRESTOR) 40 MG tablet Take 1/2 to 1 tablet daily as directed for Cholesterol   No Known Allergies PMHx:   Past Medical History:  Diagnosis Date  . Abdominal pain   . Adenomyosis   . Bell's palsy   . Dysmenorrhea   . Dyspareunia   . Endometriosis   . Hyperlipidemia   . Infertility, female   . Thyroid disease    pt was hypo now hyperthyroid  . Urinary incontinence   . Vitamin D deficiency    Immunization History  Administered Date(s) Administered  . Hepatitis B 01/11/2011  . Influenza-Unspecified 10/11/2013  . PPD Test 08/17/2014, 08/23/2015, 09/17/2016  . Td 12/17/2008   Past Surgical History:  Procedure Laterality Date  . ECTOPIC PREGNANCY SURGERY  1997   RIGHT TUBE  . IUD REMOVAL  2013   mirena,only in for 6 months  . SALPINGECTOMY Right    FHx:    Reviewed / unchanged  SHx:    Reviewed / unchanged  Systems Review:  Constitutional: Denies fever, chills, wt changes, headaches, insomnia, fatigue, night sweats, change in appetite. Eyes: Denies redness, blurred vision, diplopia, discharge, itchy, watery  eyes.  ENT: Denies discharge, congestion, post nasal drip, epistaxis, sore throat, earache, hearing loss, dental pain, tinnitus, vertigo, sinus pain, snoring.  CV: Denies chest pain, palpitations, irregular heartbeat, syncope, dyspnea, diaphoresis, orthopnea, PND, claudication or edema. Respiratory: denies cough, dyspnea, DOE, pleurisy, hoarseness, laryngitis, wheezing.  Gastrointestinal: Denies dysphagia, odynophagia, heartburn, reflux, water brash, abdominal pain or cramps, nausea, vomiting, bloating, diarrhea, constipation, hematemesis, melena, hematochezia  or hemorrhoids. Genitourinary: Denies dysuria, frequency, urgency, nocturia,  hesitancy, discharge, hematuria or flank pain. Musculoskeletal: Denies arthralgias, myalgias, stiffness, jt. swelling, pain, limping or strain/sprain.  Skin: Denies pruritus, rash, hives, warts, acne, eczema or change in skin lesion(s). Neuro: No weakness, tremor, incoordination, spasms, paresthesia or pain. Psychiatric: Denies confusion, memory loss or sensory loss. Endo: Denies change in weight, skin or hair change.  Heme/Lymph: No excessive bleeding, bruising or enlarged lymph nodes.  Physical Exam  BP 118/80   Pulse 80   Temp 97.4 F (36.3 C)   Resp 16   Ht 5\' 8"  (1.727 m)   Wt 223 lb 12.8 oz (101.5 kg)   BMI 34.03 kg/m   Appears over nourished, well groomed  and in no distress.  Eyes: PERRLA, EOMs, conjunctiva no swelling or erythema. Sinuses: No frontal/maxillary tenderness ENT/Mouth: EAC's clear, TM's nl w/o erythema, bulging. Nares clear w/o erythema, swelling, exudates. Oropharynx clear without erythema or exudates. Oral hygiene is good. Tongue normal, non obstructing. Hearing intact.  Neck: Supple. Thyroid nl. Car 2+/2+ without bruits, nodes or JVD. Chest: Respirations nl with BS clear & equal w/o rales, rhonchi, wheezing or stridor.  Cor: Heart sounds normal w/ regular rate and rhythm without sig. murmurs, gallops, clicks or rubs. Peripheral pulses normal and equal  without edema.  Abdomen: Soft & bowel sounds normal. Non-tender w/o guarding, rebound, hernias, masses or organomegaly.  Lymphatics: Unremarkable.  Musculoskeletal: Full ROM all peripheral extremities, joint stability, 5/5 strength and normal gait.  Skin: Warm, dry without exposed rashes, lesions or ecchymosis apparent.  Neuro: Cranial nerves intact, reflexes equal bilaterally. Sensory-motor testing grossly intact. Tendon reflexes grossly intact.  Pysch: Alert & oriented x 3.  Insight and judgement nl & appropriate. No ideations.  Assessment and Plan:  1. Essential hypertension  - Advised staying off meds  for now and monitoring several times/week .  - Continue medication, monitor blood pressure at home.  - Continue DASH diet. Reminder to go to the ER if any CP,  SOB, nausea, dizziness, severe HA, changes vision/speech,  left arm numbness and tingling and jaw pain.  - CBC with Differential/Platelet - BASIC METABOLIC PANEL WITH GFR - Magnesium - TSH  2. Mixed hyperlipidemia- advised stay off meds til give another 3 month trial with stricter diet & weight loss. - Continue diet/meds, exercise,& lifestyle modifications.  - Continue monitor periodic cholesterol/liver & renal functions   - Hepatic function panel - Lipid panel - TSH  3. Prediabetes  - Continue diet, exercise, lifestyle modifications.  - Monitor appropriate labs. - Hemoglobin A1c - Insulin, random  4. Vitamin D deficiency  - Continue supplementation.  - VITAMIN D 25 Hydroxy  5. Hypothyroidism  - advised probable restart meds pending today's labs. - TSH  6. Medication management  - CBC with Differential/Platelet - BASIC METABOLIC PANEL WITH GFR - Hepatic function panel - Magnesium - Lipid panel - TSH - Hemoglobin A1c - Insulin, random - VITAMIN D 25 Hydroxy        Discussed  regular exercise, BP monitoring, weight control to achieve/maintain BMI less than 25 and  discussed med and SE's. Recommended labs to assess and monitor clinical status with further disposition pending results of labs. Over 30 minutes of exam, counseling, chart review was performed.

## 2017-03-26 ENCOUNTER — Other Ambulatory Visit: Payer: Self-pay | Admitting: Internal Medicine

## 2017-03-26 ENCOUNTER — Ambulatory Visit (INDEPENDENT_AMBULATORY_CARE_PROVIDER_SITE_OTHER): Payer: BLUE CROSS/BLUE SHIELD | Admitting: Internal Medicine

## 2017-03-26 ENCOUNTER — Encounter: Payer: Self-pay | Admitting: Internal Medicine

## 2017-03-26 VITALS — BP 118/80 | HR 80 | Temp 97.4°F | Resp 16 | Ht 68.0 in | Wt 223.8 lb

## 2017-03-26 DIAGNOSIS — E038 Other specified hypothyroidism: Secondary | ICD-10-CM

## 2017-03-26 DIAGNOSIS — R7303 Prediabetes: Secondary | ICD-10-CM | POA: Diagnosis not present

## 2017-03-26 DIAGNOSIS — E559 Vitamin D deficiency, unspecified: Secondary | ICD-10-CM | POA: Diagnosis not present

## 2017-03-26 DIAGNOSIS — E782 Mixed hyperlipidemia: Secondary | ICD-10-CM

## 2017-03-26 DIAGNOSIS — Z79899 Other long term (current) drug therapy: Secondary | ICD-10-CM | POA: Diagnosis not present

## 2017-03-26 DIAGNOSIS — E063 Autoimmune thyroiditis: Secondary | ICD-10-CM

## 2017-03-26 DIAGNOSIS — I10 Essential (primary) hypertension: Secondary | ICD-10-CM | POA: Diagnosis not present

## 2017-03-26 LAB — CBC WITH DIFFERENTIAL/PLATELET
BASOS ABS: 176 {cells}/uL (ref 0–200)
BASOS PCT: 2 %
Eosinophils Absolute: 352 cells/uL (ref 15–500)
Eosinophils Relative: 4 %
HEMATOCRIT: 40.7 % (ref 35.0–45.0)
HEMOGLOBIN: 13.4 g/dL (ref 11.7–15.5)
LYMPHS ABS: 2376 {cells}/uL (ref 850–3900)
Lymphocytes Relative: 27 %
MCH: 29.8 pg (ref 27.0–33.0)
MCHC: 32.9 g/dL (ref 32.0–36.0)
MCV: 90.4 fL (ref 80.0–100.0)
MONOS PCT: 7 %
MPV: 10.2 fL (ref 7.5–12.5)
Monocytes Absolute: 616 cells/uL (ref 200–950)
Neutro Abs: 5280 cells/uL (ref 1500–7800)
Neutrophils Relative %: 60 %
Platelets: 279 10*3/uL (ref 140–400)
RBC: 4.5 MIL/uL (ref 3.80–5.10)
RDW: 14.5 % (ref 11.0–15.0)
WBC: 8.8 10*3/uL (ref 3.8–10.8)

## 2017-03-26 LAB — BASIC METABOLIC PANEL WITH GFR
BUN: 16 mg/dL (ref 7–25)
CHLORIDE: 103 mmol/L (ref 98–110)
CO2: 29 mmol/L (ref 20–31)
CREATININE: 1.14 mg/dL — AB (ref 0.50–1.10)
Calcium: 9.1 mg/dL (ref 8.6–10.2)
GFR, Est African American: 69 mL/min (ref >=60)
GFR, Est Non African American: 60 mL/min (ref >=60)
Glucose, Bld: 88 mg/dL (ref 65–99)
POTASSIUM: 3.9 mmol/L (ref 3.5–5.3)
Sodium: 140 mmol/L (ref 135–146)

## 2017-03-26 LAB — LIPID PANEL
Cholesterol: 309 mg/dL — ABNORMAL HIGH (ref <200)
HDL: 53 mg/dL (ref >50)
LDL CALC: 228 mg/dL — AB (ref <100)
Total CHOL/HDL Ratio: 5.8 Ratio — ABNORMAL HIGH (ref <5.0)
Triglycerides: 141 mg/dL (ref <150)
VLDL: 28 mg/dL (ref <30)

## 2017-03-26 LAB — MAGNESIUM: MAGNESIUM: 2.1 mg/dL (ref 1.5–2.5)

## 2017-03-26 LAB — HEPATIC FUNCTION PANEL
ALBUMIN: 4.3 g/dL (ref 3.6–5.1)
ALK PHOS: 50 U/L (ref 33–115)
ALT: 32 U/L — AB (ref 6–29)
AST: 39 U/L — AB (ref 10–30)
Bilirubin, Direct: 0.1 mg/dL (ref <=0.2)
Indirect Bilirubin: 0.4 mg/dL (ref 0.2–1.2)
Total Bilirubin: 0.5 mg/dL (ref 0.2–1.2)
Total Protein: 7.1 g/dL (ref 6.1–8.1)

## 2017-03-26 LAB — TSH: TSH: 78.91 mIU/L — ABNORMAL HIGH

## 2017-03-26 MED ORDER — PHENTERMINE HCL 37.5 MG PO TABS: ORAL_TABLET | ORAL | 3 refills | Status: DC

## 2017-03-27 ENCOUNTER — Other Ambulatory Visit: Payer: Self-pay | Admitting: Internal Medicine

## 2017-03-27 LAB — HEMOGLOBIN A1C
Hgb A1c MFr Bld: 5.3 % (ref <5.7)
Mean Plasma Glucose: 105 mg/dL

## 2017-03-27 LAB — INSULIN, RANDOM: INSULIN: 8.1 u[IU]/mL (ref 2.0–19.6)

## 2017-03-27 LAB — VITAMIN D 25 HYDROXY (VIT D DEFICIENCY, FRACTURES): Vit D, 25-Hydroxy: 22 ng/mL — ABNORMAL LOW (ref 30–100)

## 2017-04-25 ENCOUNTER — Encounter: Payer: Self-pay | Admitting: Internal Medicine

## 2017-04-25 MED ORDER — LEVOTHYROXINE SODIUM 200 MCG PO TABS: 200.0000 ug | ORAL_TABLET | Freq: Every day | ORAL | 1 refills | Status: DC

## 2017-07-01 NOTE — Progress Notes (Deleted)
Assessment and Plan:     Continue diet and meds as discussed. Further disposition pending results of labs. OVER 30 minutes of exam, counseling, chart review, referral performed  HPI 41 y.o. female  presents for 3 month follow up with hypertension, hyperlipidemia, prediabetes and vitamin D. Her blood pressure has been controlled at home, today their BP is   She does not workout. She denies chest pain, shortness of breath, dizziness.  She is on cholesterol medication and is not having myalgias. Her cholesterol is at goal. The cholesterol last visit was:   Lab Results  Component Value Date   CHOL 309 (H) 03/26/2017   HDL 53 03/26/2017   LDLCALC 228 (H) 03/26/2017   TRIG 141 03/26/2017   CHOLHDL 5.8 (H) 03/26/2017   She has been working on diet and exercise for prediabetes, and denies paresthesia of the feet, polydipsia and polyuria. Last A1C in the office was:  Lab Results  Component Value Date   HGBA1C 5.3 03/26/2017   Patient is on Vitamin D supplement.   Lab Results  Component Value Date   VD25OH 22 (L) 03/26/2017     Has myalgias, has had negative ANA, antiDNA, sed rate, RF, Ddimer.  She is on thyroid medication. Her medication was changed last visit, she is on 150 daily.  Lab Results  Component Value Date   TSH 78.91 (H) 03/26/2017  .  BMI is There is no height or weight on file to calculate BMI., she is working on diet and exercise. Wt Readings from Last 3 Encounters:  03/26/17 223 lb 12.8 oz (101.5 kg)  09/17/16 222 lb (100.7 kg)  02/27/16 218 lb 6.4 oz (99.1 kg)    Current Medications:  Current Outpatient Prescriptions on File Prior to Visit  Medication Sig Dispense Refill  . bisoprolol-hydrochlorothiazide (ZIAC) 10-6.25 MG tablet Take 1 tablet daily for BP 90 tablet 0  . Cholecalciferol (VITAMIN D3) 10000 UNITS capsule Take 10,000 Units by mouth daily.    Marland Kitchen dicyclomine (BENTYL) 20 MG tablet Take 1 tablet (20 mg total) by mouth 4 (four) times daily -  before meals  and at bedtime. 90 tablet 1  . levothyroxine (SYNTHROID) 200 MCG tablet Take 1 tablet (200 mcg total) by mouth daily. 90 tablet 1  . lisinopril (PRINIVIL,ZESTRIL) 40 MG tablet Take 1/2 to 1 tablet daily for BP 90 tablet 1  . naproxen sodium (ANAPROX) 550 MG tablet Take 550 mg by mouth 2 (two) times daily with a meal.    . phentermine (ADIPEX-P) 37.5 MG tablet Take 1/2 to 1 tablet every morning for dieting & weightloss 30 tablet 3  . rosuvastatin (CRESTOR) 40 MG tablet Take 1/2 to 1 tablet daily as directed for Cholesterol 90 tablet 1   No current facility-administered medications on file prior to visit.    Medical History:  Past Medical History:  Diagnosis Date  . Abdominal pain   . Adenomyosis   . Bell's palsy   . Dysmenorrhea   . Dyspareunia   . Endometriosis   . Hyperlipidemia   . Infertility, female   . Thyroid disease    pt was hypo now hyperthyroid  . Urinary incontinence   . Vitamin D deficiency    Allergies: No Known Allergies   Review of Systems:  Review of Systems  Constitutional: Negative for chills, diaphoresis, fever, malaise/fatigue and weight loss.  HENT: Negative.   Eyes: Negative.   Respiratory: Negative.   Cardiovascular: Negative.   Gastrointestinal: Negative for abdominal pain, blood in  stool, constipation, diarrhea, heartburn, melena, nausea and vomiting.  Genitourinary: Negative.   Musculoskeletal: Negative for back pain, falls, joint pain, myalgias and neck pain.  Skin: Negative for itching and rash.  Neurological: Negative.  Negative for weakness.  Psychiatric/Behavioral: Negative for depression, hallucinations, memory loss, substance abuse and suicidal ideas. The patient is not nervous/anxious and does not have insomnia.     Family history- Review and unchanged Social history- Review and unchanged Physical Exam: There were no vitals taken for this visit. Wt Readings from Last 3 Encounters:  03/26/17 223 lb 12.8 oz (101.5 kg)  09/17/16 222 lb  (100.7 kg)  02/27/16 218 lb 6.4 oz (99.1 kg)   General Appearance: Well nourished, in no apparent distress. Eyes: PERRLA, EOMs, conjunctiva no swelling or erythema Sinuses: No Frontal/maxillary tenderness ENT/Mouth: Ext aud canals clear, TMs without erythema, bulging. No erythema, swelling, or exudate on post pharynx.  Tonsils not swollen or erythematous. Hearing normal.  Neck: Supple, thyroid normal.  Respiratory: Respiratory effort normal, BS equal bilaterally without rales, rhonchi, wheezing or stridor.  Cardio: RRR with no MRGs. Brisk peripheral pulses without edema.  Abdomen: Soft, + BS.  Non tender, no guarding, rebound, hernias, masses. Lymphatics: Non tender without lymphadenopathy.  Musculoskeletal: Full ROM, 5/5 strength, normal gait.  Skin: Warm, dry without rashes, lesions, ecchymosis. Diffuse erythematous macular rash along anterior chest and back.  Neuro: Cranial nerves intact. Normal muscle tone, no cerebellar symptoms. Sensation intact.  Psych: Awake and oriented X 3, normal affect, Insight and Judgment appropriate.    Vicie Mutters, PA-C 7:33 AM [facility]

## 2017-07-02 ENCOUNTER — Ambulatory Visit: Payer: Self-pay | Admitting: Physician Assistant

## 2017-08-06 NOTE — Progress Notes (Deleted)
Assessment and Plan:    Continue diet and meds as discussed. Further disposition pending results of labs. 30 minutes of exam, counseling, chart review, referral performed   HPI 41 y.o. female  presents for 3 month follow up with hypertension, hyperlipidemia, prediabetes and vitamin D. Her blood pressure has been controlled at home, today their BP is   She does not workout. She denies chest pain, shortness of breath, dizziness.  She is on cholesterol medication and is having myalgias. Her cholesterol is at goal. The cholesterol last visit was:   Lab Results  Component Value Date   CHOL 309 (H) 03/26/2017   HDL 53 03/26/2017   LDLCALC 228 (H) 03/26/2017   TRIG 141 03/26/2017   CHOLHDL 5.8 (H) 03/26/2017   She has been working on diet and exercise for prediabetes, and denies paresthesia of the feet, polydipsia and polyuria. Last A1C in the office was:  Lab Results  Component Value Date   HGBA1C 5.3 03/26/2017   Patient is on Vitamin D supplement.   Lab Results  Component Value Date   VD25OH 22 (L) 03/26/2017       She has had negative ANA, antiDNA, sed rate, RF, Ddimer.  She is on thyroid medication. Her medication was changed last visit, she is on 150 daily.  Lab Results  Component Value Date   TSH 78.91 (H) 03/26/2017  .  BMI is There is no height or weight on file to calculate BMI., she is working on diet and exercise. Wt Readings from Last 3 Encounters:  03/26/17 223 lb 12.8 oz (101.5 kg)  09/17/16 222 lb (100.7 kg)  02/27/16 218 lb 6.4 oz (99.1 kg)    Current Medications:  Current Outpatient Prescriptions on File Prior to Visit  Medication Sig Dispense Refill  . bisoprolol-hydrochlorothiazide (ZIAC) 10-6.25 MG tablet Take 1 tablet daily for BP 90 tablet 0  . Cholecalciferol (VITAMIN D3) 10000 UNITS capsule Take 10,000 Units by mouth daily.    Marland Kitchen dicyclomine (BENTYL) 20 MG tablet Take 1 tablet (20 mg total) by mouth 4 (four) times daily -  before meals and at bedtime.  90 tablet 1  . levothyroxine (SYNTHROID) 200 MCG tablet Take 1 tablet (200 mcg total) by mouth daily. 90 tablet 1  . lisinopril (PRINIVIL,ZESTRIL) 40 MG tablet Take 1/2 to 1 tablet daily for BP 90 tablet 1  . naproxen sodium (ANAPROX) 550 MG tablet Take 550 mg by mouth 2 (two) times daily with a meal.    . phentermine (ADIPEX-P) 37.5 MG tablet Take 1/2 to 1 tablet every morning for dieting & weightloss 30 tablet 3  . rosuvastatin (CRESTOR) 40 MG tablet Take 1/2 to 1 tablet daily as directed for Cholesterol 90 tablet 1   No current facility-administered medications on file prior to visit.    Medical History:  Past Medical History:  Diagnosis Date  . Abdominal pain   . Adenomyosis   . Bell's palsy   . Dysmenorrhea   . Dyspareunia   . Endometriosis   . Hyperlipidemia   . Infertility, female   . Thyroid disease    pt was hypo now hyperthyroid  . Urinary incontinence   . Vitamin D deficiency    Allergies: No Known Allergies   Review of Systems:  Review of Systems  Constitutional: Negative for chills, diaphoresis, fever, malaise/fatigue and weight loss.  HENT: Negative.   Eyes: Negative.   Respiratory: Negative.   Cardiovascular: Negative.   Gastrointestinal: Negative for abdominal pain, blood in stool,  constipation, diarrhea, heartburn, melena, nausea and vomiting.  Genitourinary: Negative.   Musculoskeletal: Negative for back pain, falls, joint pain, myalgias and neck pain.  Skin: Negative for itching and rash.  Neurological: Negative.  Negative for weakness.  Psychiatric/Behavioral: Negative for depression, hallucinations, memory loss, substance abuse and suicidal ideas. The patient is not nervous/anxious and does not have insomnia.     Family history- Review and unchanged Social history- Review and unchanged Physical Exam: There were no vitals taken for this visit. Wt Readings from Last 3 Encounters:  03/26/17 223 lb 12.8 oz (101.5 kg)  09/17/16 222 lb (100.7 kg)   02/27/16 218 lb 6.4 oz (99.1 kg)   General Appearance: Well nourished, in no apparent distress. Eyes: PERRLA, EOMs, conjunctiva no swelling or erythema Sinuses: No Frontal/maxillary tenderness ENT/Mouth: Ext aud canals clear, TMs without erythema, bulging. No erythema, swelling, or exudate on post pharynx.  Tonsils not swollen or erythematous. Hearing normal.  Neck: Supple, thyroid normal.  Respiratory: Respiratory effort normal, BS equal bilaterally without rales, rhonchi, wheezing or stridor.  Cardio: RRR with no MRGs. Brisk peripheral pulses without edema.  Abdomen: Soft, + BS.  Non tender, no guarding, rebound, hernias, masses. Lymphatics: Non tender without lymphadenopathy.  Musculoskeletal: Full ROM, 5/5 strength, normal gait.  Skin: Warm, dry without rashes, lesions, ecchymosis. Diffuse erythematous macular rash along anterior chest and back.  Neuro: Cranial nerves intact. Normal muscle tone, no cerebellar symptoms. Sensation intact.  Psych: Awake and oriented X 3, normal affect, Insight and Judgment appropriate.    Vicie Mutters, PA-C 2:16 PM [facility]

## 2017-08-08 ENCOUNTER — Ambulatory Visit: Payer: Self-pay | Admitting: Physician Assistant

## 2017-10-16 ENCOUNTER — Encounter: Payer: Self-pay | Admitting: Internal Medicine

## 2017-10-23 ENCOUNTER — Encounter: Payer: Self-pay | Admitting: Adult Health

## 2017-10-23 NOTE — Progress Notes (Signed)
Complete Physical  Assessment and Plan:  Tammy Gibson was seen today for annual exam.  Diagnoses and all orders for this visit:  Encounter for general adult medical examination with abnormal findings  Essential hypertension Continue medications Monitor blood pressure at home; call if consistently over 130/80 Continue DASH diet.   Reminder to go to the ER if any CP, SOB, nausea, dizziness, severe HA, changes vision/speech, left arm numbness and tingling and jaw pain. -     Magnesium  Hypothyroidism due to Hashimoto's thyroiditis Discussed not taking medication with coffee/cream/sugar, taken 1 hour prior with water only -     TSH  Mixed hyperlipidemia Patient has stopped statin due to muscle aches; would like to strongly pursue lifestyle changes Discussed starting fiber supplement in interim  -     TSH -     Lipid panel  Vitamin D deficiency -     VITAMIN D 25 Hydroxy (Vit-D Deficiency, Fractures)  Endometriosis       -     Follow up with GYN  History of elevated glucose Discussed diet/weight loss -     Hemoglobin A1c -     Insulin, random  Medication management -     CBC with Differential/Platelet -     BASIC METABOLIC PANEL WITH GFR -     Hepatic function panel -     Urinalysis, Complete (81001)  Morbid obesity (BMI 32.06) Long discussion about weight loss, diet, and exercise; commended patient on 10 lb weight loss and many good changes.  Discussed final goal weight and current weight loss goal (200 lb) Patient will work on reducing coffee (sugar/creamer) consumption and continue good changes Patient on phentermine with benefit and no SE, taking drug breaks; continue close follow up. Return in 6 months  -     phentermine (ADIPEX-P) 37.5 MG tablet; Take 1/2 to 1 tablet every morning for dieting & weightloss  Screening for cardiovascular condition -     EKG 12-Lead  Screening for deficiency anemia -     CBC with Differential/Platelet -     Iron,Total/Total Iron Binding  Cap -     Vitamin B12  Screening for hematuria or proteinuria -     Urinalysis, Complete (81001)  Rash and other nonspecific skin eruption -     triamcinolone ointment (KENALOG) 0.1 %; Apply 1 application 2 (two) times daily topically.  CKD (chronic kidney disease), stage II Discussed tapering down of coffee, increasing water to 80-100 fluid ounces, avoid NSAIDs -     BASIC METABOLIC PANEL WITH GFR   Discussed med's effects and SE's. Screening labs and tests as requested with regular follow-up as recommended. Over 40 minutes of exam, counseling, chart review, and complex, high level critical decision making was performed this visit.   Future Appointments  Date Time Provider [place]  04/24/2018  8:45 AM Liane Comber, NP [facility] None  10/27/2018  9:00 AM Liane Comber, NP [facility] None    HPI  41 y.o. female  presents for a complete physical and follow up for has Hypothyroidism; Hyperlipidemia; Vitamin D deficiency; Endometriosis; Essential hypertension; Medication management; History of elevated glucose; Morbid obesity (BMI 32.06); and CKD (chronic kidney disease), stage II on their problem list. She reports a recurrent rash on her chest that begins 3 days prior to menstrual cycle and clears spontaneously. She reports this does itch, but without purulent discharge. She does not currently have the rash but presents a photo which shows 2-3 small injected areas on her chest.  she is down to smoking 2-3 cigarettes a day; discussed risks associated with smoking, patient is actively trying to quit. She also describes a tender small palpable lump in her right breast/axilla during menstruation which resolves and is not palpable otherwise; not present on exam today. She is currently drinking a large amount of coffee (20 oz mug refilled "maybe 10-12 times a day" with cream and sugar); discussed risks and kidney function - patient will taper down slowly on this.    BMI is Body mass  index is 32.39 kg/m., she has been working on diet and exercise. She walks 16000-18000 steps daily. She has been working on diet as well - salads and grilled chicken, seafood, avoiding fried foods, she is working on cutting down on flour- has switched to whole grain/multigrain.  Wt Readings from Last 3 Encounters:  10/24/17 213 lb (96.6 kg)  03/26/17 223 lb 12.8 oz (101.5 kg)  09/17/16 222 lb (100.7 kg)   she is prescribed phentermine for weight loss.  While on the medication they have lost 10 lbs since last visit. They deny palpitations, anxiety, trouble sleeping, elevated BP. She has been off for 35 days and requesting a refill.   She does not take her BP at home, today their BP is BP: 122/82 She does not workout but walks 16000+ steps daily at work. She denies chest pain, shortness of breath, dizziness.   She has stopped taking cholesterol medication and denies myalgias, she has been working on diet and exercise. Her cholesterol is not at goal. The cholesterol last visit was:   Lab Results  Component Value Date   CHOL 309 (H) 03/26/2017   HDL 53 03/26/2017   LDLCALC 228 (H) 03/26/2017   TRIG 141 03/26/2017   CHOLHDL 5.8 (H) 03/26/2017    Last A1C in the office was:  Lab Results  Component Value Date   HGBA1C 5.3 03/26/2017   Last GFR: Lab Results  Component Value Date   GFRNONAA 60 03/26/2017   Patient has started on Vitamin D supplement since last visit:  Lab Results  Component Value Date   VD25OH 22 (L) 03/26/2017     She is on thyroid medication. Her medication was changed last visit. Discussed appropriate method of taking medication; patient has been taking with coffee/creamer/sugar in the mornings. Discussed taking a full 60 min prior.  Lab Results  Component Value Date   TSH 78.91 (H) 03/26/2017  .    Current Medications:  Current Outpatient Medications on File Prior to Visit  Medication Sig Dispense Refill  . Cholecalciferol (VITAMIN D3) 10000 UNITS capsule  Take 10,000 Units by mouth daily.    Marland Kitchen levothyroxine (SYNTHROID) 200 MCG tablet Take 1 tablet (200 mcg total) by mouth daily. 90 tablet 1  . medroxyPROGESTERone (PROVERA) 10 MG tablet Take 1 tablet daily by mouth.    . bisoprolol-hydrochlorothiazide (ZIAC) 10-6.25 MG tablet Take 1 tablet daily for BP 90 tablet 0  . dicyclomine (BENTYL) 20 MG tablet Take 1 tablet (20 mg total) by mouth 4 (four) times daily -  before meals and at bedtime. 90 tablet 1  . lisinopril (PRINIVIL,ZESTRIL) 40 MG tablet Take 1/2 to 1 tablet daily for BP 90 tablet 1  . naproxen sodium (ANAPROX) 550 MG tablet Take 550 mg by mouth 2 (two) times daily with a meal.     No current facility-administered medications on file prior to visit.    Allergies:  No Known Allergies Medical History:  She has Hypothyroidism; Hyperlipidemia; Vitamin  D deficiency; Endometriosis; Essential hypertension; Medication management; History of elevated glucose; Morbid obesity (BMI 32.06); and CKD (chronic kidney disease), stage II on their problem list. Health Maintenance:   Immunization History  Administered Date(s) Administered  . Hepatitis B 01/11/2011  . Influenza-Unspecified 10/11/2013, 09/24/2017  . PPD Test 08/17/2014, 08/23/2015, 09/17/2016  . Td 12/17/2008   Tetanus: 2010 Pneumovax: n/a Prevnar 13: n/a Flu vaccine: 2018 Shingrix: n/a LMP: Patient's last menstrual period was 10/15/2017. Pap: 2015 - DUE - she will follow up with GYN for endometriosis follow up as well MGM: due 2021 DEXA: n/a Colonoscopy: n/a EGD: n/a  Last Dental Exam: 03/2017 - scheduled for January 2019 Last Eye Exam: 07/2017 Last Derm Exam: 2015 - will schedule follow up this year  Patient Care Team: Unk Pinto, MD as PCP - General (Internal Medicine)  Surgical History:  She has a past surgical history that includes Ectopic pregnancy surgery (1997); Salpingectomy (Right); and IUD removal (2013). Family History:  Herfamily history includes Breast  cancer in her maternal aunt; Cancer - Colon in her paternal grandfather; Diabetes in her father; Hyperlipidemia in her father; Hypertension in her father; Thyroid disease in her father, mother, paternal grandmother, and sister. Social History:  She reports that she has been smoking cigarettes.  She has a 7.50 pack-year smoking history. she has never used smokeless tobacco. She reports that she does not drink alcohol or use drugs.  Review of Systems: Review of Systems  Constitutional: Negative for malaise/fatigue and weight loss.  HENT: Negative for hearing loss and tinnitus.   Eyes: Negative for blurred vision and double vision.  Respiratory: Negative for cough, shortness of breath and wheezing.   Cardiovascular: Negative for chest pain, palpitations, orthopnea, claudication and leg swelling.  Gastrointestinal: Negative for abdominal pain, blood in stool, constipation, diarrhea, heartburn, melena, nausea and vomiting.  Genitourinary: Negative.   Musculoskeletal: Negative for joint pain and myalgias.  Skin: Negative for rash.  Neurological: Negative for dizziness, tingling, sensory change, weakness and headaches.  Endo/Heme/Allergies: Negative for polydipsia.  Psychiatric/Behavioral: Negative.   All other systems reviewed and are negative.   Physical Exam: Estimated body mass index is 32.39 kg/m as calculated from the following:   Height as of this encounter: 5\' 8"  (1.727 m).   Weight as of this encounter: 213 lb (96.6 kg). BP 122/82   Pulse 88   Temp 97.9 F (36.6 C)   Ht 5\' 8"  (1.727 m)   Wt 213 lb (96.6 kg)   LMP 10/15/2017   SpO2 98%   BMI 32.39 kg/m  General Appearance: Well nourished, in no apparent distress.  Eyes: PERRLA, EOMs, conjunctiva no swelling or erythema.  Sinuses: No Frontal/maxillary tenderness  ENT/Mouth: Ext aud canals clear, normal light reflex with TMs without erythema, bulging. Good dentition. No erythema, swelling, or exudate on post pharynx. Tonsils not  swollen or erythematous. Hearing normal.  Neck: Supple, thyroid normal. No bruits  Respiratory: Respiratory effort normal, BS equal bilaterally without rales, rhonchi, wheezing or stridor.  Cardio: RRR without murmurs, rubs or gallops. Brisk peripheral pulses without edema.  Chest: symmetric, with normal excursions and percussion.  Breasts: Symmetric, without lumps, nipple discharge, retractions.  Abdomen: Soft, nontender, no guarding, rebound, hernias, masses, or organomegaly.  Lymphatics: Non tender without lymphadenopathy.  Genitourinary: Defer to GYN Musculoskeletal: Full ROM all peripheral extremities,5/5 strength, and normal gait.  Skin: Warm, dry without rashes, lesions, ecchymosis. Nails on hands somewhat flattened/brittle.  Neuro: Cranial nerves intact, reflexes equal bilaterally. Normal muscle tone, no cerebellar  symptoms. Sensation intact.  Psych: Awake and oriented X 3, normal affect, Insight and Judgment appropriate.   EKG: WNL no ST changes.  Gorden Harms Sadako Cegielski 12:52 PM [facility]

## 2017-10-24 ENCOUNTER — Ambulatory Visit (INDEPENDENT_AMBULATORY_CARE_PROVIDER_SITE_OTHER): Payer: BLUE CROSS/BLUE SHIELD | Admitting: Adult Health

## 2017-10-24 ENCOUNTER — Encounter: Payer: Self-pay | Admitting: Adult Health

## 2017-10-24 VITALS — BP 122/82 | HR 88 | Temp 97.9°F | Ht 68.0 in | Wt 213.0 lb

## 2017-10-24 DIAGNOSIS — Z130 Encounter for screening for diseases of the blood and blood-forming organs and certain disorders involving the immune mechanism: Secondary | ICD-10-CM

## 2017-10-24 DIAGNOSIS — E038 Other specified hypothyroidism: Secondary | ICD-10-CM

## 2017-10-24 DIAGNOSIS — Z8639 Personal history of other endocrine, nutritional and metabolic disease: Secondary | ICD-10-CM

## 2017-10-24 DIAGNOSIS — Z136 Encounter for screening for cardiovascular disorders: Secondary | ICD-10-CM

## 2017-10-24 DIAGNOSIS — N182 Chronic kidney disease, stage 2 (mild): Secondary | ICD-10-CM | POA: Insufficient documentation

## 2017-10-24 DIAGNOSIS — Z1211 Encounter for screening for malignant neoplasm of colon: Secondary | ICD-10-CM

## 2017-10-24 DIAGNOSIS — N809 Endometriosis, unspecified: Secondary | ICD-10-CM

## 2017-10-24 DIAGNOSIS — Z0000 Encounter for general adult medical examination without abnormal findings: Secondary | ICD-10-CM

## 2017-10-24 DIAGNOSIS — I10 Essential (primary) hypertension: Secondary | ICD-10-CM

## 2017-10-24 DIAGNOSIS — Z0001 Encounter for general adult medical examination with abnormal findings: Secondary | ICD-10-CM

## 2017-10-24 DIAGNOSIS — E782 Mixed hyperlipidemia: Secondary | ICD-10-CM

## 2017-10-24 DIAGNOSIS — E063 Autoimmune thyroiditis: Secondary | ICD-10-CM

## 2017-10-24 DIAGNOSIS — Z79899 Other long term (current) drug therapy: Secondary | ICD-10-CM

## 2017-10-24 DIAGNOSIS — R21 Rash and other nonspecific skin eruption: Secondary | ICD-10-CM

## 2017-10-24 DIAGNOSIS — E559 Vitamin D deficiency, unspecified: Secondary | ICD-10-CM

## 2017-10-24 DIAGNOSIS — Z1389 Encounter for screening for other disorder: Secondary | ICD-10-CM

## 2017-10-24 MED ORDER — PHENTERMINE HCL 37.5 MG PO TABS: ORAL_TABLET | ORAL | 3 refills | Status: DC

## 2017-10-24 MED ORDER — TRIAMCINOLONE ACETONIDE 0.1 % EX OINT: 1.0000 "application " | TOPICAL_OINTMENT | Freq: Two times a day (BID) | CUTANEOUS | 1 refills | Status: DC

## 2017-10-24 NOTE — Patient Instructions (Signed)
Taper down on coffee gradually - work up to 100-120 ounces of water a day. Push fiber in diet for cholesterol or may start supplement like benefiber/citrucel. Reduce red meat as much as possible - save for special occasions.   Continue great work with walking and dietary changes!  Follow up with GYN/derm as discussed.   Preventive Care for Adults  A healthy lifestyle and preventive care can promote health and wellness. Preventive health guidelines for women include the following key practices.  A routine yearly physical is a good way to check with your health care provider about your health and preventive screening. It is a chance to share any concerns and updates on your health and to receive a thorough exam.  Visit your dentist for a routine exam and preventive care every 6 months. Brush your teeth twice a day and floss once a day. Good oral hygiene prevents tooth decay and gum disease.  The frequency of eye exams is based on your age, health, family medical history, use of contact lenses, and other factors. Follow your health care provider's recommendations for frequency of eye exams.  Eat a healthy diet. Foods like vegetables, fruits, whole grains, low-fat dairy products, and lean protein foods contain the nutrients you need without too many calories. Decrease your intake of foods high in solid fats, added sugars, and salt. Eat the right amount of calories for you.Get information about a proper diet from your health care provider, if necessary.  Regular physical exercise is one of the most important things you can do for your health. Most adults should get at least 150 minutes of moderate-intensity exercise (any activity that increases your heart rate and causes you to sweat) each week. In addition, most adults need muscle-strengthening exercises on 2 or more days a week.  Maintain a healthy weight. The body mass index (BMI) is a screening tool to identify possible weight problems. It  provides an estimate of body fat based on height and weight. Your health care provider can find your BMI and can help you achieve or maintain a healthy weight.For adults 20 years and older:  A BMI below 18.5 is considered underweight.  A BMI of 18.5 to 24.9 is normal.  A BMI of 25 to 29.9 is considered overweight.  A BMI of 30 and above is considered obese.  Maintain normal blood lipids and cholesterol levels by exercising and minimizing your intake of saturated fat. Eat a balanced diet with plenty of fruit and vegetables. Blood tests for lipids and cholesterol should begin at age 5 and be repeated every 5 years. If your lipid or cholesterol levels are high, you are over 50, or you are at high risk for heart disease, you may need your cholesterol levels checked more frequently.Ongoing high lipid and cholesterol levels should be treated with medicines if diet and exercise are not working.  If you smoke, find out from your health care provider how to quit. If you do not use tobacco, do not start.  Lung cancer screening is recommended for adults aged 50-80 years who are at high risk for developing lung cancer because of a history of smoking. A yearly low-dose CT scan of the lungs is recommended for people who have at least a 30-pack-year history of smoking and are a current smoker or have quit within the past 15 years. A pack year of smoking is smoking an average of 1 pack of cigarettes a day for 1 year (for example: 1 pack a day for  30 years or 2 packs a day for 15 years). Yearly screening should continue until the smoker has stopped smoking for at least 15 years. Yearly screening should be stopped for people who develop a health problem that would prevent them from having lung cancer treatment.  High blood pressure causes heart disease and increases the risk of stroke. Your blood pressure should be checked at least every 1 to 2 years. Ongoing high blood pressure should be treated with medicines if  weight loss and exercise do not work.  If you are 42-59 years old, ask your health care provider if you should take aspirin to prevent strokes.  Diabetes screening involves taking a blood sample to check your fasting blood sugar level. This should be done once every 3 years, after age 71, if you are within normal weight and without risk factors for diabetes. Testing should be considered at a younger age or be carried out more frequently if you are overweight and have at least 1 risk factor for diabetes.  Breast cancer screening is essential preventive care for women. You should practice "breast self-awareness." This means understanding the normal appearance and feel of your breasts and may include breast self-examination. Any changes detected, no matter how small, should be reported to a health care provider. Women in their 60s and 30s should have a clinical breast exam (CBE) by a health care provider as part of a regular health exam every 1 to 3 years. After age 55, women should have a CBE every year. Starting at age 7, women should consider having a mammogram (breast X-ray test) every year. Women who have a family history of breast cancer should talk to their health care provider about genetic screening. Women at a high risk of breast cancer should talk to their health care providers about having an MRI and a mammogram every year.  Breast cancer gene (BRCA)-related cancer risk assessment is recommended for women who have family members with BRCA-related cancers. BRCA-related cancers include breast, ovarian, tubal, and peritoneal cancers. Having family members with these cancers may be associated with an increased risk for harmful changes (mutations) in the breast cancer genes BRCA1 and BRCA2. Results of the assessment will determine the need for genetic counseling and BRCA1 and BRCA2 testing.  Routine pelvic exams to screen for cancer are no longer recommended for nonpregnant women who are considered  low risk for cancer of the pelvic organs (ovaries, uterus, and vagina) and who do not have symptoms. Ask your health care provider if a screening pelvic exam is right for you.  If you have had past treatment for cervical cancer or a condition that could lead to cancer, you need Pap tests and screening for cancer for at least 20 years after your treatment. If Pap tests have been discontinued, your risk factors (such as having a new sexual partner) need to be reassessed to determine if screening should be resumed. Some women have medical problems that increase the chance of getting cervical cancer. In these cases, your health care provider may recommend more frequent screening and Pap tests.  Colorectal cancer can be detected and often prevented. Most routine colorectal cancer screening begins at the age of 8 years and continues through age 59 years. However, your health care provider may recommend screening at an earlier age if you have risk factors for colon cancer. On a yearly basis, your health care provider may provide home test kits to check for hidden blood in the stool. Use of a small  camera at the end of a tube, to directly examine the colon (sigmoidoscopy or colonoscopy), can detect the earliest forms of colorectal cancer. Talk to your health care provider about this at age 77, when routine screening begins. Direct exam of the colon should be repeated every 5-10 years through age 78 years, unless early forms of pre-cancerous polyps or small growths are found.  Hepatitis C blood testing is recommended for all people born from [birth year] through 1965 and any individual with known risks for hepatitis C.  Pra  Osteoporosis is a disease in which the bones lose minerals and strength with aging. This can result in serious bone fractures or breaks. The risk of osteoporosis can be identified using a bone density scan. Women ages 47 years and over and women at risk for fractures or osteoporosis should discuss  screening with their health care providers. Ask your health care provider whether you should take a calcium supplement or vitamin D to reduce the rate of osteoporosis.  Menopause can be associated with physical symptoms and risks. Hormone replacement therapy is available to decrease symptoms and risks. You should talk to your health care provider about whether hormone replacement therapy is right for you.  Use sunscreen. Apply sunscreen liberally and repeatedly throughout the day. You should seek shade when your shadow is shorter than you. Protect yourself by wearing long sleeves, pants, a wide-brimmed hat, and sunglasses year round, whenever you are outdoors.  Once a month, do a whole body skin exam, using a mirror to look at the skin on your back. Tell your health care provider of new moles, moles that have irregular borders, moles that are larger than a pencil eraser, or moles that have changed in shape or color.  Stay current with required vaccines (immunizations).  Influenza vaccine. All adults should be immunized every year.  Tetanus, diphtheria, and acellular pertussis (Td, Tdap) vaccine. Pregnant women should receive 1 dose of Tdap vaccine during each pregnancy. The dose should be obtained regardless of the length of time since the last dose. Immunization is preferred during the 27th-36th week of gestation. An adult who has not previously received Tdap or who does not know her vaccine status should receive 1 dose of Tdap. This initial dose should be followed by tetanus and diphtheria toxoids (Td) booster doses every 10 years. Adults with an unknown or incomplete history of completing a 3-dose immunization series with Td-containing vaccines should begin or complete a primary immunization series including a Tdap dose. Adults should receive a Td booster every 10 years.  Varicella vaccine. An adult without evidence of immunity to varicella should receive 2 doses or a second dose if she has  previously received 1 dose. Pregnant females who do not have evidence of immunity should receive the first dose after pregnancy. This first dose should be obtained before leaving the health care facility. The second dose should be obtained 4-8 weeks after the first dose.  Human papillomavirus (HPV) vaccine. Females aged 13-26 years who have not received the vaccine previously should obtain the 3-dose series. The vaccine is not recommended for use in pregnant females. However, pregnancy testing is not needed before receiving a dose. If a female is found to be pregnant after receiving a dose, no treatment is needed. In that case, the remaining doses should be delayed until after the pregnancy. Immunization is recommended for any person with an immunocompromised condition through the age of 40 years if she did not get any or all doses earlier. During  the 3-dose series, the second dose should be obtained 4-8 weeks after the first dose. The third dose should be obtained 24 weeks after the first dose and 16 weeks after the second dose.  Zoster vaccine. One dose is recommended for adults aged 62 years or older unless certain conditions are present.  Measles, mumps, and rubella (MMR) vaccine. Adults born before [birth year] generally are considered immune to measles and mumps. Adults born in [birth year] or later should have 1 or more doses of MMR vaccine unless there is a contraindication to the vaccine or there is laboratory evidence of immunity to each of the three diseases. A routine second dose of MMR vaccine should be obtained at least 28 days after the first dose for students attending postsecondary schools, health care workers, or international travelers. People who received inactivated measles vaccine or an unknown type of measles vaccine during 1963-1967 should receive 2 doses of MMR vaccine. People who received inactivated mumps vaccine or an unknown type of mumps vaccine before 1979 and are at high risk for mumps  infection should consider immunization with 2 doses of MMR vaccine. For females of childbearing age, rubella immunity should be determined. If there is no evidence of immunity, females who are not pregnant should be vaccinated. If there is no evidence of immunity, females who are pregnant should delay immunization until after pregnancy. Unvaccinated health care workers born before [birth year] who lack laboratory evidence of measles, mumps, or rubella immunity or laboratory confirmation of disease should consider measles and mumps immunization with 2 doses of MMR vaccine or rubella immunization with 1 dose of MMR vaccine.  Pneumococcal 13-valent conjugate (PCV13) vaccine. When indicated, a person who is uncertain of her immunization history and has no record of immunization should receive the PCV13 vaccine. An adult aged 71 years or older who has certain medical conditions and has not been previously immunized should receive 1 dose of PCV13 vaccine. This PCV13 should be followed with a dose of pneumococcal polysaccharide (PPSV23) vaccine. The PPSV23 vaccine dose should be obtained at least 8 weeks after the dose of PCV13 vaccine. An adult aged 45 years or older who has certain medical conditions and previously received 1 or more doses of PPSV23 vaccine should receive 1 dose of PCV13. The PCV13 vaccine dose should be obtained 1 or more years after the last PPSV23 vaccine dose.    Pneumococcal polysaccharide (PPSV23) vaccine. When PCV13 is also indicated, PCV13 should be obtained first. All adults aged 68 years and older should be immunized. An adult younger than age 31 years who has certain medical conditions should be immunized. Any person who resides in a nursing home or long-term care facility should be immunized. An adult smoker should be immunized. People with an immunocompromised condition and certain other conditions should receive both PCV13 and PPSV23 vaccines. People with human immunodeficiency virus (HIV)  infection should be immunized as soon as possible after diagnosis. Immunization during chemotherapy or radiation therapy should be avoided. Routine use of PPSV23 vaccine is not recommended for American Indians, [place] Natives, or people younger than 65 years unless there are medical conditions that require PPSV23 vaccine. When indicated, people who have unknown immunization and have no record of immunization should receive PPSV23 vaccine. One-time revaccination 5 years after the first dose of PPSV23 is recommended for people aged 19-64 years who have chronic kidney failure, nephrotic syndrome, asplenia, or immunocompromised conditions. People who received 1-2 doses of PPSV23 before age 93 years should receive another dose of PPSV23  vaccine at age 49 years or later if at least 5 years have passed since the previous dose. Doses of PPSV23 are not needed for people immunized with PPSV23 at or after age 15 years.  Preventive Services / Frequency   Ages 62 to 76 years  Blood pressure check.  Lipid and cholesterol check.  Lung cancer screening. / Every year if you are aged 20-80 years and have a 30-pack-year history of smoking and currently smoke or have quit within the past 15 years. Yearly screening is stopped once you have quit smoking for at least 15 years or develop a health problem that would prevent you from having lung cancer treatment.  Clinical breast exam.** / Every year after age 61 years.  BRCA-related cancer risk assessment.** / For women who have family members with a BRCA-related cancer (breast, ovarian, tubal, or peritoneal cancers).  Mammogram.** / Every year beginning at age 76 years and continuing for as long as you are in good health. Consult with your health care provider.  Pap test.** / Every 3 years starting at age 55 years through age 69 or 64 years with a history of 3 consecutive normal Pap tests.  HPV screening.** / Every 3 years from ages 72 years through ages 79 to 59  years with a history of 3 consecutive normal Pap tests.  Fecal occult blood test (FOBT) of stool. / Every year beginning at age 83 years and continuing until age 34 years. You may not need to do this test if you get a colonoscopy every 10 years.  Flexible sigmoidoscopy or colonoscopy.** / Every 5 years for a flexible sigmoidoscopy or every 10 years for a colonoscopy beginning at age 37 years and continuing until age 31 years.  Hepatitis C blood test.** / For all people born from [birth year] through 1966 and any individual with known risks for hepatitis C.  Skin self-exam. / Monthly.  Influenza vaccine. / Every year.  Tetanus, diphtheria, and acellular pertussis (Tdap/Td) vaccine.** / Consult your health care provider. Pregnant women should receive 1 dose of Tdap vaccine during each pregnancy. 1 dose of Td every 10 years.  Varicella vaccine.** / Consult your health care provider. Pregnant females who do not have evidence of immunity should receive the first dose after pregnancy.  Zoster vaccine.** / 1 dose for adults aged 63 years or older.  Pneumococcal 13-valent conjugate (PCV13) vaccine.** / Consult your health care provider.  Pneumococcal polysaccharide (PPSV23) vaccine.** / 1 to 2 doses if you smoke cigarettes or if you have certain conditions.  Meningococcal vaccine.** / Consult your health care provider.  Hepatitis A vaccine.** / Consult your health care provider.  Hepatitis B vaccine.** / Consult your health care provider. Screening for abdominal aortic aneurysm (AAA)  by ultrasound is recommended for people over 50 who have history of high blood pressure or who are current or former smokers.

## 2017-10-25 LAB — URINALYSIS, COMPLETE
BILIRUBIN URINE: NEGATIVE
Bacteria, UA: NONE SEEN /HPF
GLUCOSE, UA: NEGATIVE
Hgb urine dipstick: NEGATIVE
Hyaline Cast: NONE SEEN /LPF
KETONES UR: NEGATIVE
LEUKOCYTES UA: NEGATIVE
NITRITE: NEGATIVE
PH: 6.5 (ref 5.0–8.0)
Protein, ur: NEGATIVE
RBC / HPF: NONE SEEN /HPF (ref 0–2)
SPECIFIC GRAVITY, URINE: 1.016 (ref 1.001–1.03)
WBC UA: NONE SEEN /HPF (ref 0–5)

## 2017-10-25 LAB — LIPID PANEL
CHOL/HDL RATIO: 5 (calc) — AB (ref <5.0)
Cholesterol: 265 mg/dL — ABNORMAL HIGH (ref <200)
HDL: 53 mg/dL (ref >50)
LDL CHOLESTEROL (CALC): 188 mg/dL — AB
NON-HDL CHOLESTEROL (CALC): 212 mg/dL — AB (ref <130)
Triglycerides: 111 mg/dL (ref <150)

## 2017-10-25 LAB — HEPATIC FUNCTION PANEL
AG Ratio: 1.4 (calc) (ref 1.0–2.5)
ALKALINE PHOSPHATASE (APISO): 76 U/L (ref 33–115)
ALT: 17 U/L (ref 6–29)
AST: 18 U/L (ref 10–30)
Albumin: 3.9 g/dL (ref 3.6–5.1)
BILIRUBIN DIRECT: 0.1 mg/dL (ref 0.0–0.2)
BILIRUBIN INDIRECT: 0.3 mg/dL (ref 0.2–1.2)
GLOBULIN: 2.7 g/dL (ref 1.9–3.7)
TOTAL PROTEIN: 6.6 g/dL (ref 6.1–8.1)
Total Bilirubin: 0.4 mg/dL (ref 0.2–1.2)

## 2017-10-25 LAB — CBC WITH DIFFERENTIAL/PLATELET
BASOS ABS: 102 {cells}/uL (ref 0–200)
Basophils Relative: 1.2 %
EOS ABS: 391 {cells}/uL (ref 15–500)
Eosinophils Relative: 4.6 %
HCT: 44.6 % (ref 35.0–45.0)
Hemoglobin: 14.5 g/dL (ref 11.7–15.5)
Lymphs Abs: 2618 cells/uL (ref 850–3900)
MCH: 28.2 pg (ref 27.0–33.0)
MCHC: 32.5 g/dL (ref 32.0–36.0)
MCV: 86.8 fL (ref 80.0–100.0)
MPV: 10.5 fL (ref 7.5–12.5)
Monocytes Relative: 6.2 %
NEUTROS PCT: 57.2 %
Neutro Abs: 4862 cells/uL (ref 1500–7800)
Platelets: 315 10*3/uL (ref 140–400)
RBC: 5.14 10*6/uL — ABNORMAL HIGH (ref 3.80–5.10)
RDW: 13.2 % (ref 11.0–15.0)
TOTAL LYMPHOCYTE: 30.8 %
WBC: 8.5 10*3/uL (ref 3.8–10.8)
WBCMIX: 527 {cells}/uL (ref 200–950)

## 2017-10-25 LAB — VITAMIN D 25 HYDROXY (VIT D DEFICIENCY, FRACTURES): VIT D 25 HYDROXY: 30 ng/mL (ref 30–100)

## 2017-10-25 LAB — BASIC METABOLIC PANEL WITH GFR
BUN: 14 mg/dL (ref 7–25)
CO2: 29 mmol/L (ref 20–32)
Calcium: 9.2 mg/dL (ref 8.6–10.2)
Chloride: 104 mmol/L (ref 98–110)
Creat: 0.77 mg/dL (ref 0.50–1.10)
GFR, EST AFRICAN AMERICAN: 110 mL/min/{1.73_m2} (ref >=60)
GFR, Est Non African American: 95 mL/min/{1.73_m2} (ref >=60)
Glucose, Bld: 81 mg/dL (ref 65–99)
Potassium: 4.6 mmol/L (ref 3.5–5.3)
SODIUM: 138 mmol/L (ref 135–146)

## 2017-10-25 LAB — TSH: TSH: 20.4 mIU/L — ABNORMAL HIGH

## 2017-10-25 LAB — VITAMIN B12: Vitamin B-12: 354 pg/mL (ref 200–1100)

## 2017-10-25 LAB — INSULIN, RANDOM: INSULIN: 10.8 u[IU]/mL (ref 2.0–19.6)

## 2017-10-25 LAB — IRON, TOTAL/TOTAL IRON BINDING CAP
%SAT: 15 % (ref 11–50)
IRON: 53 ug/dL (ref 40–190)
TIBC: 365 ug/dL (ref 250–450)

## 2017-10-25 LAB — HEMOGLOBIN A1C
Hgb A1c MFr Bld: 5.5 % of total Hgb (ref <5.7)
Mean Plasma Glucose: 111 (calc)
eAG (mmol/L): 6.2 (calc)

## 2017-12-08 ENCOUNTER — Other Ambulatory Visit: Payer: Self-pay | Admitting: Physician Assistant

## 2017-12-14 ENCOUNTER — Emergency Department (HOSPITAL_COMMUNITY)
Admission: EM | Admit: 2017-12-14 | Discharge: 2017-12-14 | Disposition: A | Discharge status: Home / Self Care | Payer: BLUE CROSS/BLUE SHIELD | Attending: Physician Assistant | Admitting: Physician Assistant

## 2017-12-14 ENCOUNTER — Emergency Department (HOSPITAL_COMMUNITY): Payer: BLUE CROSS/BLUE SHIELD

## 2017-12-14 ENCOUNTER — Encounter (HOSPITAL_COMMUNITY): Payer: Self-pay | Admitting: Emergency Medicine

## 2017-12-14 DIAGNOSIS — N182 Chronic kidney disease, stage 2 (mild): Secondary | ICD-10-CM | POA: Insufficient documentation

## 2017-12-14 DIAGNOSIS — E785 Hyperlipidemia, unspecified: Secondary | ICD-10-CM | POA: Diagnosis not present

## 2017-12-14 DIAGNOSIS — R7989 Other specified abnormal findings of blood chemistry: Secondary | ICD-10-CM

## 2017-12-14 DIAGNOSIS — I129 Hypertensive chronic kidney disease with stage 1 through stage 4 chronic kidney disease, or unspecified chronic kidney disease: Secondary | ICD-10-CM | POA: Diagnosis not present

## 2017-12-14 DIAGNOSIS — R1011 Right upper quadrant pain: Secondary | ICD-10-CM | POA: Diagnosis not present

## 2017-12-14 DIAGNOSIS — F17210 Nicotine dependence, cigarettes, uncomplicated: Secondary | ICD-10-CM | POA: Insufficient documentation

## 2017-12-14 DIAGNOSIS — Z79899 Other long term (current) drug therapy: Secondary | ICD-10-CM | POA: Diagnosis not present

## 2017-12-14 DIAGNOSIS — R1013 Epigastric pain: Secondary | ICD-10-CM | POA: Diagnosis present

## 2017-12-14 DIAGNOSIS — K2100 Gastro-esophageal reflux disease with esophagitis, without bleeding: Secondary | ICD-10-CM

## 2017-12-14 LAB — URINALYSIS, ROUTINE W REFLEX MICROSCOPIC
BILIRUBIN URINE: NEGATIVE
Glucose, UA: NEGATIVE mg/dL
Ketones, ur: NEGATIVE mg/dL
Leukocytes, UA: NEGATIVE
Nitrite: NEGATIVE
PH: 5 (ref 5.0–8.0)
Protein, ur: NEGATIVE mg/dL
SPECIFIC GRAVITY, URINE: 1.033 — AB (ref 1.005–1.030)

## 2017-12-14 LAB — BASIC METABOLIC PANEL
ANION GAP: 8 (ref 5–15)
BUN: 22 mg/dL — ABNORMAL HIGH (ref 6–20)
CALCIUM: 9.1 mg/dL (ref 8.9–10.3)
CO2: 27 mmol/L (ref 22–32)
CREATININE: 0.97 mg/dL (ref 0.44–1.00)
Chloride: 102 mmol/L (ref 101–111)
Glucose, Bld: 99 mg/dL (ref 65–99)
Potassium: 5.1 mmol/L (ref 3.5–5.1)
SODIUM: 137 mmol/L (ref 135–145)

## 2017-12-14 LAB — I-STAT TROPONIN, ED: TROPONIN I, POC: 0 ng/mL (ref 0.00–0.08)

## 2017-12-14 LAB — CBC
HCT: 44.1 % (ref 36.0–46.0)
HEMOGLOBIN: 13.9 g/dL (ref 12.0–15.0)
MCH: 28.5 pg (ref 26.0–34.0)
MCHC: 31.5 g/dL (ref 30.0–36.0)
MCV: 90.6 fL (ref 78.0–100.0)
PLATELETS: 318 10*3/uL (ref 150–400)
RBC: 4.87 MIL/uL (ref 3.87–5.11)
RDW: 14.4 % (ref 11.5–15.5)
WBC: 10.6 10*3/uL — AB (ref 4.0–10.5)

## 2017-12-14 LAB — I-STAT BETA HCG BLOOD, ED (MC, WL, AP ONLY)

## 2017-12-14 LAB — LIPASE, BLOOD: LIPASE: 36 U/L (ref 11–51)

## 2017-12-14 MED ORDER — RANITIDINE HCL 150 MG/10ML PO SYRP
150.0000 mg | ORAL_SOLUTION | Freq: Once | ORAL | Status: AC
  Administered 2017-12-14: 150 mg via ORAL
  Filled 2017-12-14: qty 10

## 2017-12-14 MED ORDER — SUCRALFATE 1 GM/10ML PO SUSP: 1.0000 g | Freq: Three times a day (TID) | ORAL | 0 refills | Status: DC

## 2017-12-14 MED ORDER — OMEPRAZOLE 20 MG PO CPDR: 20.0000 mg | DELAYED_RELEASE_CAPSULE | Freq: Every day | ORAL | 0 refills | Status: DC

## 2017-12-14 MED ORDER — GI COCKTAIL ~~LOC~~
30.0000 mL | Freq: Once | ORAL | Status: AC
  Administered 2017-12-14: 30 mL via ORAL
  Filled 2017-12-14: qty 30

## 2017-12-14 MED ORDER — ONDANSETRON 4 MG PO TBDP
4.0000 mg | ORAL_TABLET | Freq: Once | ORAL | Status: AC | PRN
  Administered 2017-12-14: 4 mg via ORAL
  Filled 2017-12-14: qty 1

## 2017-12-14 NOTE — ED Notes (Signed)
Patient transported to Ultrasound 

## 2017-12-14 NOTE — ED Provider Notes (Signed)
[facility] Provider Note   CSN: [id number] Arrival date & time: 12/14/17  0112     History   Chief Complaint Chief Complaint  Patient presents with  . Abdominal Pain    HPI Tammy Gibson is a 41 y.o. female.  HPI   41 year old female past medical history significant for thyroid disease, vitamin D deficiency, dysmenorrhea presenting today with epigastric pain.  Patient having epigastric pain for the last 5 months.  She reports she had it last year as well and was told to take Zantac.  However she stopped taking it.  Patient seen in urgent care yesterday for the same symptoms and told to take over-the-counter Prilosec or Zantac.  She has not started this.  She is here for further evaluation.  She reports that she has a burning sensation in her epigastric region especially after eating.  Radiates up her throat sometimes.  Not associated with exertion.  Associated with food intake.  She also has a little bit of right upper quadrant pain as well.  Patient's husband at bedside concerned about her gallbladder.  No fevers.  No nausea no vomiting.  Past Medical History:  Diagnosis Date  . Abdominal pain   . Adenomyosis   . Bell's palsy   . Dysmenorrhea   . Dyspareunia   . Endometriosis   . Hyperlipidemia   . Infertility, female   . Thyroid disease    pt was hypo now hyperthyroid  . Urinary incontinence   . Vitamin D deficiency     Patient Active Problem List   Diagnosis Date Noted  . CKD (chronic kidney disease), stage II 10/24/2017  . Morbid obesity (BMI 32.06) 08/21/2015  . Essential hypertension 02/17/2014  . Medication management 02/17/2014  . History of elevated glucose 02/17/2014  . Endometriosis 01/01/2014  . Hypothyroidism   . Hyperlipidemia   . Vitamin D deficiency     Past Surgical History:  Procedure Laterality Date  . ECTOPIC PREGNANCY SURGERY  1997   RIGHT TUBE  . IUD REMOVAL  2013   mirena,only in for 6 months  .  SALPINGECTOMY Right     OB History    Gravida Para Term Preterm AB Living   2 0 0 0 2 0   SAB TAB Ectopic Multiple Live Births     1 1           Home Medications    Prior to Admission medications   Medication Sig Start Date End Date Taking? Authorizing Provider  bisoprolol-hydrochlorothiazide [facility]) 10-6.25 MG tablet Take 1 tablet daily for BP 02/27/16 05/29/16  Unk Pinto, MD  Cholecalciferol (VITAMIN D3) 10000 UNITS capsule Take 10,000 Units by mouth daily.    [provider]  dicyclomine (BENTYL) 20 MG tablet Take 1 tablet (20 mg total) by mouth 4 (four) times daily -  before meals and at bedtime. 06/13/15 06/12/16  Forcucci, Courtney, PA-C  levothyroxine (SYNTHROID, LEVOTHROID) 200 MCG tablet TAKE 1 TABLET (200 MCG TOTAL) BY MOUTH DAILY. 12/08/17 12/08/18  Unk Pinto, MD  lisinopril (PRINIVIL,ZESTRIL) 40 MG tablet Take 1/2 to 1 tablet daily for BP 09/17/16 03/18/17  Unk Pinto, MD  medroxyPROGESTERone (PROVERA) 10 MG tablet Take 1 tablet daily by mouth.    [provider]  naproxen sodium (ANAPROX) 550 MG tablet Take 550 mg by mouth 2 (two) times daily with a meal.    [provider]  phentermine (ADIPEX-P) 37.5 MG tablet Take 1/2 to 1 tablet every morning for  dieting & weightloss 10/24/17 02/23/18  Liane Comber, NP  triamcinolone ointment (KENALOG) 0.1 % Apply 1 application 2 (two) times daily topically. 10/24/17   Liane Comber, NP    Family History Family History  Problem Relation Age of Onset  . Hypertension Father   . Hyperlipidemia Father   . Diabetes Father   . Thyroid disease Father   . Thyroid disease Mother   . Breast cancer Maternal Aunt   . Thyroid disease Sister   . Cancer - Colon Paternal Grandfather   . Thyroid disease Paternal Grandmother        Hashimoto's    Social History Social History   Tobacco Use  . Smoking status: Current Every Day Smoker    Packs/day: 0.25    Years: 30.00    Pack years: 7.50     Types: Cigarettes  . Smokeless tobacco: Never Used  . Tobacco comment: smokes about 2-3 cigarettes a day  Substance Use Topics  . Alcohol use: No    Alcohol/week: 0.0 oz    Comment: Patient stopped alcohol due to elevated liver enzymes.  . Drug use: No     Allergies   Patient has no known allergies.   Review of Systems Review of Systems  Constitutional: Negative for activity change.  HENT: Negative for congestion.   Respiratory: Negative for shortness of breath.   Cardiovascular: Negative for chest pain.  Gastrointestinal: Positive for abdominal pain and nausea. Negative for vomiting.  Genitourinary: Negative for dysuria.  All other systems reviewed and are negative.    Physical Exam Updated Vital Signs BP 123/85 (BP Location: Left Arm)   Pulse 79   Temp 98.5 F (36.9 C) (Oral)   Resp 20   Ht 5\' 8"  (1.727 m)   Wt 97.1 kg (214 lb)   SpO2 96%   BMI 32.54 kg/m   Physical Exam  Constitutional: She is oriented to person, place, and time. She appears well-developed and well-nourished.  HENT:  Head: Normocephalic and atraumatic.  Eyes: Right eye exhibits no discharge.  Cardiovascular: Normal rate, regular rhythm and normal heart sounds.  No murmur heard. Pulmonary/Chest: Effort normal and breath sounds normal. She has no wheezes. She has no rales.  Abdominal: Soft. She exhibits no distension. There is tenderness in the right upper quadrant and epigastric area.  Neurological: She is oriented to person, place, and time.  Skin: Skin is warm and dry. She is not diaphoretic.  Psychiatric: She has a normal mood and affect.  Nursing note and vitals reviewed.    ED Treatments / Results  Labs (all labs ordered are listed, but only abnormal results are displayed) Labs Reviewed  BASIC METABOLIC PANEL - Abnormal; Notable for the following components:      Result Value   BUN 22 (*)    All other components within normal limits  CBC - Abnormal; Notable for the following  components:   WBC 10.6 (*)    All other components within normal limits  URINALYSIS, ROUTINE W REFLEX MICROSCOPIC - Abnormal; Notable for the following components:   Specific Gravity, Urine 1.033 (*)    Hgb urine dipstick SMALL (*)    Bacteria, UA RARE (*)    Squamous Epithelial / LPF 0-5 (*)    All other components within normal limits  LIPASE, BLOOD  I-STAT TROPONIN, ED  I-STAT BETA HCG BLOOD, ED ([facility], [facility], [facility] ONLY)  I-STAT BETA HCG BLOOD, ED ([facility], [facility], [facility] ONLY)    EKG  EKG Interpretation None  Radiology Dg Chest 2 View  Result Date: 12/14/2017 CLINICAL DATA:  41 year old female with epigastric pain. EXAM: CHEST  2 VIEW COMPARISON:  Chest radiograph dated 12/29/2013 FINDINGS: The heart size and mediastinal contours are within normal limits. Both lungs are clear. The visualized skeletal structures are unremarkable. IMPRESSION: No active cardiopulmonary disease. Electronically Signed   By: Anner Crete M.D.   On: 12/14/2017 02:09    Procedures Procedures (including critical care time)  Medications Ordered in ED Medications  gi cocktail (Maalox,Lidocaine,Donnatal) (not administered)  ranitidine (ZANTAC) 150 MG/10ML syrup 150 mg (not administered)  ondansetron (ZOFRAN-ODT) disintegrating tablet 4 mg (4 mg Oral Given 12/14/17 0145)     Initial Impression / Assessment and Plan / ED Course  I have reviewed the triage vital signs and the nursing notes.  Pertinent labs & imaging results that were available during my care of the patient were reviewed by me and considered in my medical decision making (see chart for details).    41 year old female past medical history significant for thyroid disease, vitamin D deficiency, dysmenorrhea presenting today with epigastric pain.  Patient having epigastric pain for the last 5 months.  She reports she had it last year as well and was told to take Zantac.  However she stopped taking it.  Patient seen in urgent care yesterday for the  same symptoms and told to take over-the-counter Prilosec or Zantac.  She has not started this.  She is here for further evaluation.  She reports that she has a burning sensation in her epigastric region especially after eating.  Radiates up her throat sometimes.  Not associated with exertion.  Associated with food intake.  She also has a little bit of right upper quadrant pain as well.  Patient's husband at bedside concerned about her gallbladder.  No fevers.  No nausea no vomiting.  9:53 AM Patient well-appearing 41 year old female here with symptoms consistent with reflux.  Patient does have a small amount of epigastric tenderness.  Will get right upper quadrant ultrasound.  However long discussion had about PPI use and will give patient diet information as well as carafate to help with symptoms.  12:11 PM Ultrasound negative.  Will treat for GERD.  Patient normal vital signs tolerating p.o. appears well at time of discharge.  Final Clinical Impressions(s) / ED Diagnoses   Final diagnoses:  RUQ pain    ED Discharge Orders    None       Macarthur Critchley, MD 12/14/17 1211

## 2017-12-14 NOTE — ED Triage Notes (Signed)
Reports having what she thought was heartburn since Saturday.  Seen at [facility] and given GI cocktail.  Reports that the burning was relieved but still having cramping in upper abdomen which radiates around right side of chest into back.  Also reports having blurred/double vision upon waking every morning that clears up in about an hour to two hours.

## 2017-12-14 NOTE — Discharge Instructions (Signed)
Use omeprazole daily.  He may also use the Carafate with meals to help with symptoms.  Please follow-up with GI if not improving in 3-4 weeks after treatment.

## 2017-12-19 ENCOUNTER — Ambulatory Visit: Payer: Self-pay | Admitting: Internal Medicine

## 2017-12-23 DIAGNOSIS — R5381 Other malaise: Secondary | ICD-10-CM | POA: Diagnosis not present

## 2017-12-23 DIAGNOSIS — R112 Nausea with vomiting, unspecified: Secondary | ICD-10-CM | POA: Diagnosis not present

## 2017-12-23 NOTE — Progress Notes (Signed)
Assessment and Plan:  Tammy Gibson was seen today for diplopia.  Diagnoses and all orders for this visit:  Exophthalmos Urgent ophthalmology vs neurology pending CT results - r/o tumor Rapidly progressing vertical diplopia  New headaches, asymmetrical EOMs -     TSH -     CT Head Wo Contrast; Future  Diplopia -     CT Head Wo Contrast; Future  Hypothyroidism, unspecified type ? Hashimoto's thyroiditis - + family hx, poorly controlled past 3+ years despite multiple attempts at medication titration -     TSH -     Ambulatory referral to Endocrinology  Further disposition pending results of labs. Discussed med's effects and SE's.   Over 30 minutes of exam, counseling, chart review, and critical decision making was performed.   Future Appointments  Date Time Provider [place]  12/25/2017  3:00 PM Pyrtle, Lajuan Lines, MD [facility]  04/24/2018  8:45 AM Liane Comber, NP [facility] None  10/27/2018  9:00 AM Liane Comber, NP [facility] None    ------------------------------------------------------------------------------------------------------------------   HPI BP 132/84   Pulse 77   Temp (!) 97.5 F (36.4 C)   Ht 5\' 8"  (1.727 m)   Wt 217 lb (98.4 kg)   LMP 12/12/2017 (Approximate)   SpO2 98%   BMI 32.99 kg/m   42 y.o.female with hx of hypothyroidism treated by synthroid presents for R eye bulging/verticle double vision - she first noted R eye bulging after Thanksiving, began having vision changes shortly after which are progressive. She also endorses new headaches; describes as pressure in the top of her head "it feels like it's going to blow off" - she also endorses some photosensitivity. Pain is worse with supine position; she has been sleeping in recliner. She also endorses some dizziness, ? Related to changes in depth perception, difficulty driving at night.   She was evaluated by optometry - Dr. Gilford Rile with [facility] in [place] - per the patient vision was  intact other than vertical double vision "3 measures" - per the patient he told her he thought something was putting pressure on the eye that is behind the eye socket where he cannot evaluate.  She is on thyroid medication - 200 mcg- remains poorly controlled despite over the last 3+ years on review. At last visit we discussed she needs to take medication 60 prior to coffee with cream/sugar - she has been doing so since 11/8. Discussed at that time if TSH not improved at next visit will refer to endocrinology.  Component     Latest Ref Rng & Units 05/10/2015 06/13/2015 08/23/2015 02/27/2016  TSH     mIU/L 105.118 (H) 23.561 (H) 88.737 (H) 108.17 (H)   Component     Latest Ref Rng & Units 09/17/2016 03/26/2017 10/24/2017  TSH     mIU/L 93.84 (H) 78.91 (H) 20.40 (H)    She also has N/V associated with eating ongoing for 7-10 days; was evaluated by ER, has appointment scheduled with GI tomorrow.   Past Medical History:  Diagnosis Date  . Abdominal pain   . Adenomyosis   . Bell's palsy   . Dysmenorrhea   . Dyspareunia   . Endometriosis   . Hyperlipidemia   . Infertility, female   . Thyroid disease    pt was hypo now hyperthyroid  . Urinary incontinence   . Vitamin D deficiency      No Known Allergies  Current Outpatient Medications on File Prior to Visit  Medication Sig  . calcium  carbonate (TUMS EX) 750 MG chewable tablet Chew 2 tablets by mouth as needed for heartburn.  . Cholecalciferol (VITAMIN D3) 10000 UNITS capsule Take 10,000 Units by mouth daily.  Marland Kitchen levothyroxine (SYNTHROID, LEVOTHROID) 200 MCG tablet TAKE 1 TABLET (200 MCG TOTAL) BY MOUTH DAILY.  Marland Kitchen omeprazole (PRILOSEC) 20 MG capsule Take 1 capsule (20 mg total) by mouth daily.  . ranitidine (ZANTAC) 150 MG tablet Take 150 mg by mouth as needed for heartburn (abdominal pain).  . sucralfate (CARAFATE) 1 GM/10ML suspension Take 10 mLs (1 g total) by mouth 4 (four) times daily -  with meals and at bedtime.  . triamcinolone  ointment (KENALOG) 0.1 % Apply 1 application 2 (two) times daily topically.  . bisoprolol-hydrochlorothiazide (ZIAC) 10-6.25 MG tablet Take 1 tablet daily for BP  . dicyclomine (BENTYL) 20 MG tablet Take 1 tablet (20 mg total) by mouth 4 (four) times daily -  before meals and at bedtime.  Marland Kitchen ibuprofen (ADVIL,MOTRIN) 200 MG tablet Take 600 mg by mouth every 6 (six) hours as needed for moderate pain or cramping.  Marland Kitchen lisinopril (PRINIVIL,ZESTRIL) 40 MG tablet Take 1/2 to 1 tablet daily for BP  . naproxen sodium (ANAPROX) 550 MG tablet Take 550 mg by mouth 2 (two) times daily with a meal.  . phentermine (ADIPEX-P) 37.5 MG tablet Take 1/2 to 1 tablet every morning for dieting & weightloss (Patient not taking: Reported on 12/14/2017)   No current facility-administered medications on file prior to visit.     ROS: Review of Systems  Constitutional: Negative for chills, diaphoresis, fever, malaise/fatigue and weight loss.  Eyes: Positive for double vision and photophobia. Negative for blurred vision, pain (Intermittent quiver/throbbing), discharge and redness.  Gastrointestinal: Positive for nausea and vomiting. Negative for abdominal pain.  Neurological: Positive for headaches. Negative for dizziness and tingling.     Physical Exam:  BP 132/84   Pulse 77   Temp (!) 97.5 F (36.4 C)   Ht 5\' 8"  (1.727 m)   Wt 217 lb (98.4 kg)   LMP 12/12/2017 (Approximate)   SpO2 98%   BMI 32.99 kg/m   General Appearance: Well nourished, in no apparent distress. Eyes: PERRLA, EOMs limited in R eye with superior diagonal medial and lateral gaze limited compared to left, globe not notably firm compared to L, conjunctiva no swelling or erythema. R eye bulging approx 3-5 mm, able to close eye.  Sinuses: No Frontal/maxillary tenderness ENT/Mouth: Ext aud canals clear, TMs without erythema, bulging. No erythema, swelling, or exudate on post pharynx.  Tonsils not swollen or erythematous. Hearing normal.  Neck:  Supple, thyroid normal.  Respiratory: Respiratory effort normal, BS equal bilaterally without rales, rhonchi, wheezing or stridor.  Cardio: RRR with no MRGs. Brisk peripheral pulses without edema.  Abdomen: Soft, + BS.  Non tender, no guarding, rebound, hernias, masses. Lymphatics: Non tender without lymphadenopathy.  Musculoskeletal: Full ROM, 5/5 strength, normal gait.  Skin: Warm, dry without rashes, lesions, ecchymosis.  Neuro: Cranial nerves intact - questionable limitation to R cranial nerve 4 and 6 with limitations in EOMs. No nystagmus. Normal muscle tone, no cerebellar symptoms. Sensation intact.  Psych: Awake and oriented X 3, normal affect, Insight and Judgment appropriate.     Izora Ribas, NP 2:16 PM [facility]

## 2017-12-24 ENCOUNTER — Encounter: Payer: Self-pay | Admitting: Adult Health

## 2017-12-24 ENCOUNTER — Ambulatory Visit (INDEPENDENT_AMBULATORY_CARE_PROVIDER_SITE_OTHER): Payer: 59 | Admitting: Adult Health

## 2017-12-24 VITALS — BP 132/84 | HR 77 | Temp 97.5°F | Ht 68.0 in | Wt 217.0 lb

## 2017-12-24 DIAGNOSIS — E039 Hypothyroidism, unspecified: Secondary | ICD-10-CM | POA: Diagnosis not present

## 2017-12-24 DIAGNOSIS — H532 Diplopia: Secondary | ICD-10-CM

## 2017-12-24 DIAGNOSIS — H0520 Unspecified exophthalmos: Secondary | ICD-10-CM | POA: Diagnosis not present

## 2017-12-24 NOTE — Patient Instructions (Signed)
Exophthalmos Exophthalmos, also called proptosis, is a condition in which one or both eyes are positioned farther forward in the eye socket than normal. The eyes look as if one or both eyes are bulging out or protruding. When the eyes are pushed forward, damage can be done to:  The main nerve between the eye and the brain that contains the nerves for vision (optic nerve).  The muscles that make the eye move.  The inside of the eye because of increased pressure (glaucoma).  The front surface of the eye (cornea) because of exposure and dryness.  What are the causes? Exophthalmos is most often caused by an underlying condition, such as:  Overactive thyroid (hyperthyroidism, Graves disease).  Glaucoma.  Anything pushing the eyes forward from behind, such as: ? A tumor. ? Eye cancer. ? Bleeding behind the eye from a tumor or from blood vessel problems. ? Trauma that causes bleeding behind the eye. ? Problems with the arteries and veins behind the eye, such as an aneurysm. ? Cysts or a pus-filled area (abscess) behind the eye. ? Cancer of the blood system, such as lymphoma. ? Infection behind the eye.  Eye injury.  What are the signs or symptoms? The symptoms of exophthalmos may vary depending on the underlying cause. Symptoms may include:  Bulging eye or eyes.  Dry and irritated eyes.  Eyes not closing all the way when asleep.  More of the white part (sclera) of your eye being visible.  Double vision, meaning that you see two of everything (diplopia).  Trouble looking up with one or both of your eyes.  Symptoms of the condition that is causing exophthalmos.  How is this diagnosed? Your health care provider or eye doctor (ophthalmologist) will perform a physical exam and take a medical history. He or she can measure how far forward your eye is or your eyes are, compared to normal. Other tests may include:  Blood tests.  CT scan or MRI.  Ultrasound.  Other tests that  are used to look at structures and areas around the eyes.  How is this treated? Treatment involves finding the cause of exophthalmos and treating that underlying condition. The symptoms of exophthalmos may be relieved by:  Medicines. These may include medicines to reduce inflammation.  Surgery. Surgery can relieve the pressure on the eyes. It can also free up the eye muscles so the eyes can move properly. Surgery may be needed if: ? You have severe double vision. ? There is danger to your eyes.  Corrective lenses. If you have mild double vision, it may be possible to relieve your symptoms with special glasses that contain prisms.  Contact a health care provider if:  You have double vision.  You have trouble looking up.  You have new symptoms, or your symptoms get worse. This information is not intended to replace advice given to you by your health care provider. Make sure you discuss any questions you have with your health care provider. Document Released: 11/03/2004 Document Revised: 05/10/2016 Document Reviewed: 07/13/2014 Elsevier Interactive Patient Education  [employer].

## 2017-12-25 ENCOUNTER — Encounter: Payer: Self-pay | Admitting: Adult Health

## 2017-12-25 ENCOUNTER — Ambulatory Visit
Admission: RE | Admit: 2017-12-25 | Discharge: 2017-12-25 | Disposition: A | Discharge status: Home / Self Care | Payer: 59 | Source: Ambulatory Visit | Attending: Adult Health | Admitting: Adult Health

## 2017-12-25 ENCOUNTER — Other Ambulatory Visit: Payer: Self-pay | Admitting: Adult Health

## 2017-12-25 ENCOUNTER — Ambulatory Visit (INDEPENDENT_AMBULATORY_CARE_PROVIDER_SITE_OTHER): Payer: 59 | Admitting: Internal Medicine

## 2017-12-25 ENCOUNTER — Encounter: Payer: Self-pay | Admitting: Internal Medicine

## 2017-12-25 VITALS — BP 124/82 | HR 78 | Ht 68.0 in | Wt 218.8 lb

## 2017-12-25 DIAGNOSIS — R51 Headache: Secondary | ICD-10-CM | POA: Diagnosis not present

## 2017-12-25 DIAGNOSIS — R195 Other fecal abnormalities: Secondary | ICD-10-CM | POA: Diagnosis not present

## 2017-12-25 DIAGNOSIS — R112 Nausea with vomiting, unspecified: Secondary | ICD-10-CM

## 2017-12-25 DIAGNOSIS — R1013 Epigastric pain: Secondary | ICD-10-CM | POA: Diagnosis not present

## 2017-12-25 DIAGNOSIS — H532 Diplopia: Secondary | ICD-10-CM

## 2017-12-25 DIAGNOSIS — K219 Gastro-esophageal reflux disease without esophagitis: Secondary | ICD-10-CM

## 2017-12-25 DIAGNOSIS — H0520 Unspecified exophthalmos: Secondary | ICD-10-CM

## 2017-12-25 LAB — TSH: TSH: 14.13 m[IU]/L — AB

## 2017-12-25 MED ORDER — GLYCOPYRROLATE 1.5 MG PO TABS: 1.0000 | ORAL_TABLET | Freq: Three times a day (TID) | ORAL | 2 refills | Status: DC | PRN

## 2017-12-25 MED ORDER — PANTOPRAZOLE SODIUM 40 MG PO TBEC: 40.0000 mg | DELAYED_RELEASE_TABLET | Freq: Two times a day (BID) | ORAL | 2 refills | Status: DC

## 2017-12-25 NOTE — Progress Notes (Signed)
Patient ID: Tammy Gibson, female   DOB: Sep 12, 1976, 42 y.o.   MRN: [id number] HPI: Tammy Gibson is a 42 year old female with a history of chronic loose stools, history of elevated liver enzymes, hypothyroidism, hyperlipidemia, dysmenorrhea with endometriosis who is seen in consultation at the request of Dr. Melford Aase to evaluate upper abdominal pain with associated abdominal bloating, nausea and vomiting.  She is also having ongoing issues with loose stools.  She is here alone today.  She was seen to evaluate elevated liver enzymes and loose stools for years ago but never in follow-up.  She reports that over the last 1-2 months having developed issues with heartburn which have been somewhat long-standing.  What was new is she developed a gnawing and burning epigastric abdominal pain.  This was also at times cramping and like a "hard spasm".  She is developed bloating with eating and at times nausea and vomiting with eating.  She has been avoiding sodas, coffee and acidic foods.  She is lost about 6 pounds.  She was previously using Advil for joint pains but switched to Tylenol after being seen in the emergency room.  She has reported increased burping and overall gassy feeling.  Her loose stools have been chronic for her occurring 2-4 times daily, often after eating.  Nonbloody non-melenic stools.  She has been using omeprazole 20 mg, periodic Zantac and Tums.  She was recently prescribed liquid Carafate 4 times daily.  The Carafate has helped with the burning epigastric pain but not with the cramping spasm.  She is also still having some heartburn despite omeprazole at her current dose.  When seen in the ER she had an abdominal ultrasound right upper quadrant which was normal.  At that time she had a mild white count 10.6 but her blood counts were otherwise normal.  Her liver enzymes had normalized as of November 2018.  Her lipase was normal.  Her iron studies were normal.  She has been treated for  hypothyroidism and had a very elevated TSH earlier this year.  This has been improving on hormone replacement.  Past Medical History:  Diagnosis Date  . Abdominal pain   . Adenomyosis   . Bell's palsy   . Dysmenorrhea   . Dyspareunia   . Elevated LFTs   . Endometriosis   . Hyperlipidemia   . Infertility, female   . Thyroid disease    pt was hypo now hyperthyroid  . Urinary incontinence   . Vitamin D deficiency     Past Surgical History:  Procedure Laterality Date  . ECTOPIC PREGNANCY SURGERY  1997   RIGHT TUBE  . IUD REMOVAL  2013   mirena,only in for 6 months  . SALPINGECTOMY Right     Outpatient Medications Prior to Visit  Medication Sig Dispense Refill  . calcium carbonate (TUMS EX) 750 MG chewable tablet Chew 2 tablets by mouth as needed for heartburn.    . Cholecalciferol (VITAMIN D3) 10000 UNITS capsule Take 10,000 Units by mouth daily.    Marland Kitchen ibuprofen (ADVIL,MOTRIN) 200 MG tablet Take 600 mg by mouth every 6 (six) hours as needed for moderate pain or cramping.    Marland Kitchen levothyroxine (SYNTHROID, LEVOTHROID) 200 MCG tablet TAKE 1 TABLET (200 MCG TOTAL) BY MOUTH DAILY. 90 tablet 1  . naproxen sodium (ANAPROX) 550 MG tablet Take 550 mg by mouth 2 (two) times daily with a meal.    . ranitidine (ZANTAC) 150 MG tablet Take 150 mg by mouth as needed for heartburn (  abdominal pain).    . sucralfate (CARAFATE) 1 GM/10ML suspension Take 10 mLs (1 g total) by mouth 4 (four) times daily -  with meals and at bedtime. 420 mL 0  . triamcinolone ointment (KENALOG) 0.1 % Apply 1 application 2 (two) times daily topically. 80 g 1  . omeprazole (PRILOSEC) 20 MG capsule Take 1 capsule (20 mg total) by mouth daily. 30 capsule 0  . bisoprolol-hydrochlorothiazide (ZIAC) 10-6.25 MG tablet Take 1 tablet daily for BP 90 tablet 0  . dicyclomine (BENTYL) 20 MG tablet Take 1 tablet (20 mg total) by mouth 4 (four) times daily -  before meals and at bedtime. 90 tablet 1  . lisinopril (PRINIVIL,ZESTRIL) 40  MG tablet Take 1/2 to 1 tablet daily for BP 90 tablet 1  . phentermine (ADIPEX-P) 37.5 MG tablet Take 1/2 to 1 tablet every morning for dieting & weightloss (Patient not taking: Reported on 12/25/2017) 30 tablet 3   No facility-administered medications prior to visit.     No Known Allergies  Family History  Problem Relation Age of Onset  . Hypertension Father   . Hyperlipidemia Father   . Diabetes Father   . Thyroid disease Father   . Thyroid disease Mother   . Breast cancer Maternal Aunt   . Thyroid disease Sister   . Cancer - Colon Paternal Grandfather   . Thyroid disease Paternal Grandmother        Hashimoto's    Social History   Tobacco Use  . Smoking status: Current Every Day Smoker    Packs/day: 0.25    Years: 30.00    Pack years: 7.50    Types: Cigarettes  . Smokeless tobacco: Never Used  . Tobacco comment: smokes about 2-3 cigarettes a day  Substance Use Topics  . Alcohol use: No    Alcohol/week: 0.0 oz    Comment: Patient stopped alcohol due to elevated liver enzymes.  . Drug use: No    ROS: As per history of present illness, otherwise negative  BP 124/82   Pulse 78   Ht 5\' 8"  (1.727 m)   Wt 218 lb 12.8 oz (99.2 kg)   LMP 12/12/2017 (Approximate)   SpO2 96%   BMI 33.27 kg/m  Constitutional: Well-developed and well-nourished. No distress. HEENT: Normocephalic and atraumatic. Oropharynx is clear and moist. Conjunctivae are normal.  No scleral icterus. Neck: Neck supple. Trachea midline. Cardiovascular: Normal rate, regular rhythm and intact distal pulses. No M/R/G Pulmonary/chest: Effort normal and breath sounds normal. No wheezing, rales or rhonchi. Abdominal: Soft, epigastric tenderness without rebound or guarding, nondistended. Bowel sounds active throughout. There are no masses palpable. No hepatosplenomegaly. Extremities: no clubbing, cyanosis, or edema Neurological: Alert and oriented to person place and time. Skin: Skin is warm and dry.   Psychiatric: Normal mood and affect. Behavior is normal.  RELEVANT LABS AND IMAGING: CBC    Component Value Date/Time   WBC 10.6 (H) 12/14/2017 0149   RBC 4.87 12/14/2017 0149   HGB 13.9 12/14/2017 0149   HCT 44.1 12/14/2017 0149   PLT 318 12/14/2017 0149   MCV 90.6 12/14/2017 0149   MCH 28.5 12/14/2017 0149   MCHC 31.5 12/14/2017 0149   RDW 14.4 12/14/2017 0149   LYMPHSABS 2,618 10/24/2017 0936   MONOABS 616 03/26/2017 1234   EOSABS 391 10/24/2017 0936   BASOSABS 102 10/24/2017 0936    CMP     Component Value Date/Time   NA 137 12/14/2017 0149   K 5.1 12/14/2017 0149  CL 102 12/14/2017 0149   CO2 27 12/14/2017 0149   GLUCOSE 99 12/14/2017 0149   BUN 22 (H) 12/14/2017 0149   CREATININE 0.97 12/14/2017 0149   CREATININE 0.77 10/24/2017 0936   CALCIUM 9.1 12/14/2017 0149   PROT 6.6 10/24/2017 0936   ALBUMIN 4.3 03/26/2017 1234   AST 18 10/24/2017 0936   ALT 17 10/24/2017 0936   ALKPHOS 50 03/26/2017 1234   BILITOT 0.4 10/24/2017 0936   GFRNONAA >60 12/14/2017 0149   GFRNONAA 95 10/24/2017 0936   GFRAA >60 12/14/2017 0149   GFRAA 110 10/24/2017 0936   Iron/TIBC/Ferritin/ %Sat    Component Value Date/Time   IRON 53 10/24/2017 0936   TIBC 365 10/24/2017 0936   FERRITIN 75 11/18/2014 1521   IRONPCTSAT 15 10/24/2017 0936    ASSESSMENT/PLAN: 42 year old female with a history of chronic loose stools, history of elevated liver enzymes, hypothyroidism, hyperlipidemia, dysmenorrhea with endometriosis who is seen in consultation at the request of Dr. Melford Aase to evaluate upper abdominal pain with associated abdominal bloating, nausea and vomiting.  1.  Epigastric abdominal pain/nausea with vomiting/loose stools/heartburn--symptoms are consistent with ulcer-like dyspepsia and also GERD.  She is currently on low-dose PPI and having breakthrough heartburn.  She also has had some improvement with the recently added Carafate. --I recommended that we put her on a more potent  PPI.  I am changing her to pantoprazole 40 mg twice daily AC.  She will continue Carafate 1 g liquid suspension 3 times daily before meals and at bedtime.  Add Glycate 1.5 mg 3 times daily as needed for the upper abdominal cramping pain and spasm.  She is instructed to continue to avoid NSAIDs.  I would like her to follow-up in 4-6 weeks with me or an advanced practitioner for continuity and to see if she has improved.  If not upper endoscopy is recommended.  2.  Chronic loose stools --would likely be a candidate for rifaximin therapy but would like to treat the above issues first before focusing on this more chronic problem.  She previously had a negative celiac panel.  If #1 has improved at follow-up we can discuss this issue at that time.     HW:[id number], Gwyndolyn Saxon, [address] [place] [address] [place], [place] [address]

## 2017-12-25 NOTE — Patient Instructions (Addendum)
Please do not use NSAID's (example. Ibuprofen, naproxen).  Discontinue omeprazole.  We have sent the following medications to your pharmacy for you to pick up at your convenience: Pantoprazole 40 mg twice daily before meals. Glycate 1.5 g three times daily as needed for upper abdominal spasms and cramping.  Please continue carafate three times daily before meals and at bedtime.  Please follow up with Dr Hilarie Fredrickson or an extender (PA or nurse practitioner) in 4-6 weeks.  If you are age 44 or older, your body mass index should be between 23-30. Your Body mass index is 33.27 kg/m. If this is out of the aforementioned range listed, please consider follow up with your Primary Care Provider.  If you are age 73 or younger, your body mass index should be between 19-25. Your Body mass index is 33.27 kg/m. If this is out of the aformentioned range listed, please consider follow up with your Primary Care Provider.

## 2017-12-27 ENCOUNTER — Telehealth: Payer: Self-pay | Admitting: *Deleted

## 2017-12-27 NOTE — Telephone Encounter (Signed)
Glycate is not approved through patient's insurance. Please advise of alternative option.

## 2017-12-30 MED ORDER — GLYCOPYRROLATE 1 MG PO TABS: 1.0000 mg | ORAL_TABLET | Freq: Two times a day (BID) | ORAL | 3 refills | Status: DC | PRN

## 2017-12-30 NOTE — Telephone Encounter (Signed)
I have spoken with patient to advise of insurance issue with medication and Dr Vena Rua new recommendation. She verbalizes understanding and has also scheduled 4 week follow up with Nicoletta Ba as we requested when she was last seen in the office.

## 2017-12-30 NOTE — Telephone Encounter (Signed)
Rubinol Forte 1 mg [employer]

## 2017-12-30 NOTE — Telephone Encounter (Signed)
Left voicemail for patient to call back. New rx sent for robinul forte 1 mg twice daily as needed in place of Glycate.

## 2018-01-07 DIAGNOSIS — H532 Diplopia: Secondary | ICD-10-CM | POA: Diagnosis not present

## 2018-01-07 DIAGNOSIS — E0500 Thyrotoxicosis with diffuse goiter without thyrotoxic crisis or storm: Secondary | ICD-10-CM | POA: Diagnosis not present

## 2018-01-07 DIAGNOSIS — H538 Other visual disturbances: Secondary | ICD-10-CM | POA: Diagnosis not present

## 2018-01-20 DIAGNOSIS — H5340 Unspecified visual field defects: Secondary | ICD-10-CM | POA: Diagnosis not present

## 2018-01-23 ENCOUNTER — Encounter: Payer: Self-pay | Admitting: Adult Health

## 2018-01-24 ENCOUNTER — Ambulatory Visit: Payer: 59 | Admitting: Physician Assistant

## 2018-02-04 IMAGING — US US ABDOMEN LIMITED
1 series · 14 of 25 positions shown · non-contrast
Comparison: 03/02/2016

CLINICAL DATA: Four day history of right upper quadrant pain.

EXAM:
ULTRASOUND ABDOMEN LIMITED RIGHT UPPER QUADRANT

[Series 1: us abdomen limited · 0.25mm/px · 14 of 27 slices shown]
[im 1/27]
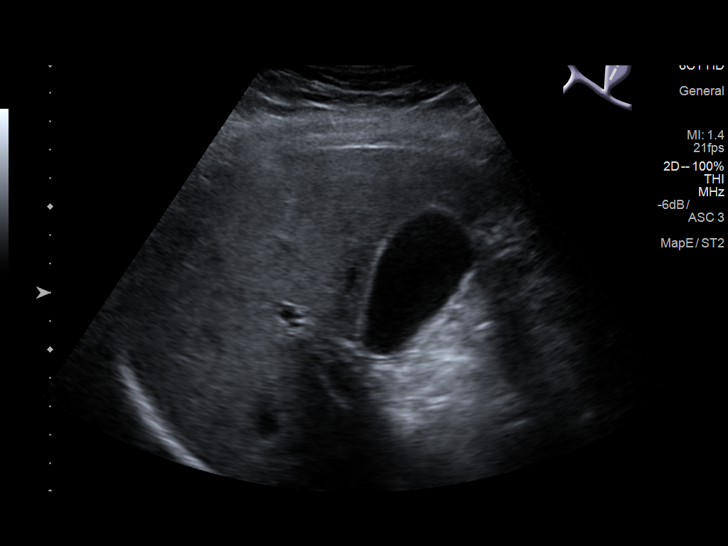
[im 3/27]
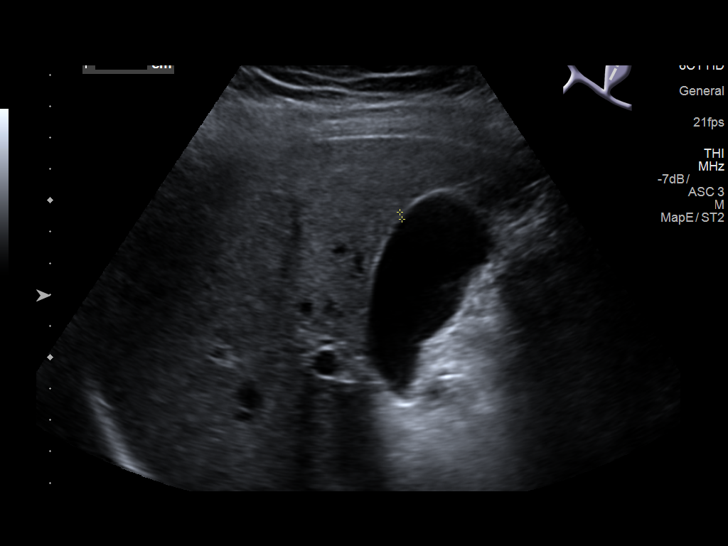
[im 5/27]
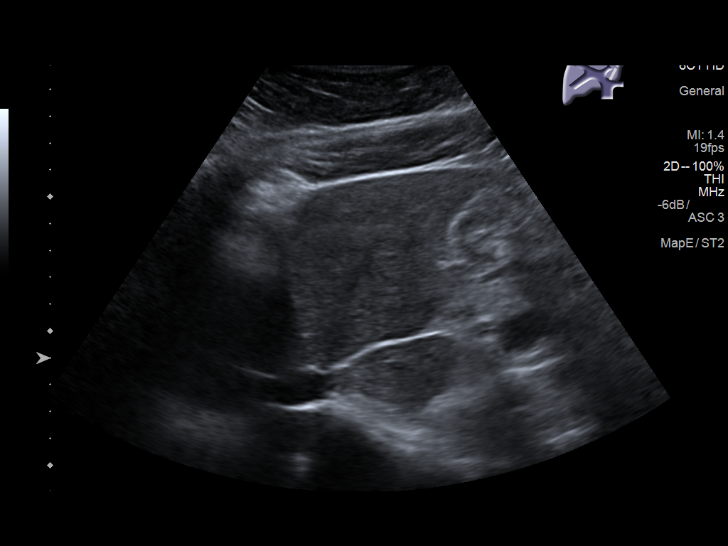
[im 7/27]
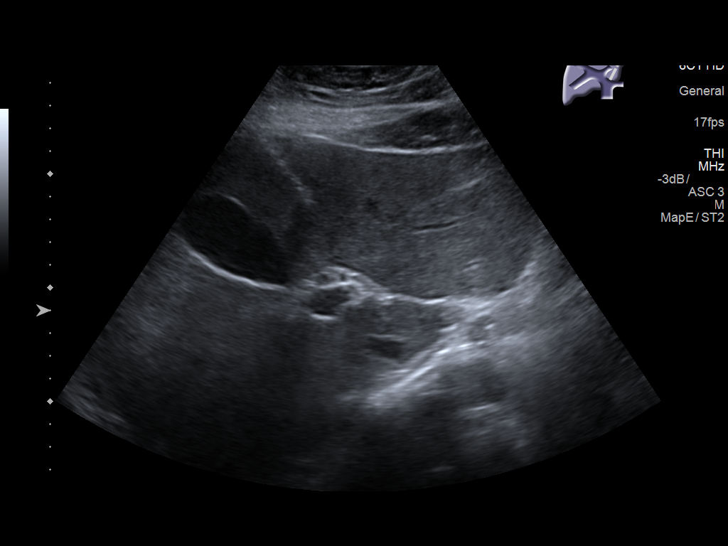
[im 9/27]
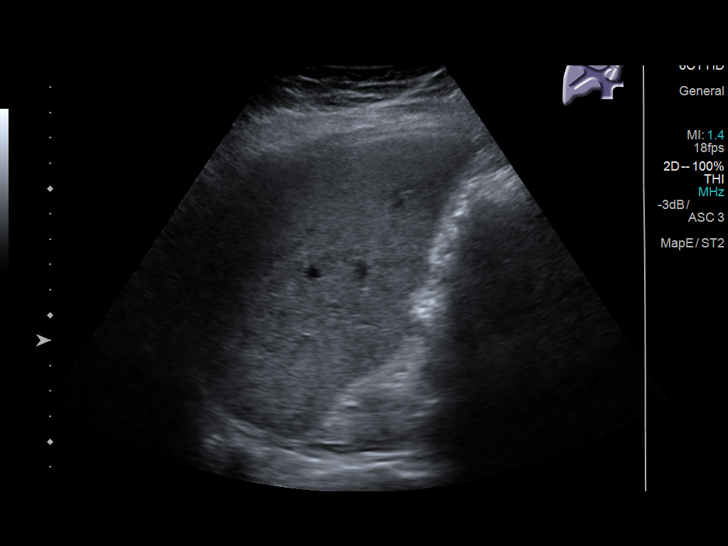
[im 10/27]
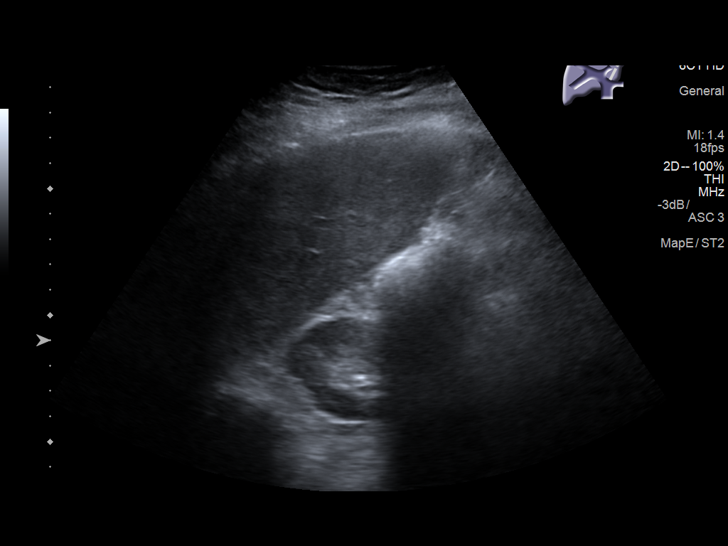
[im 12/27]
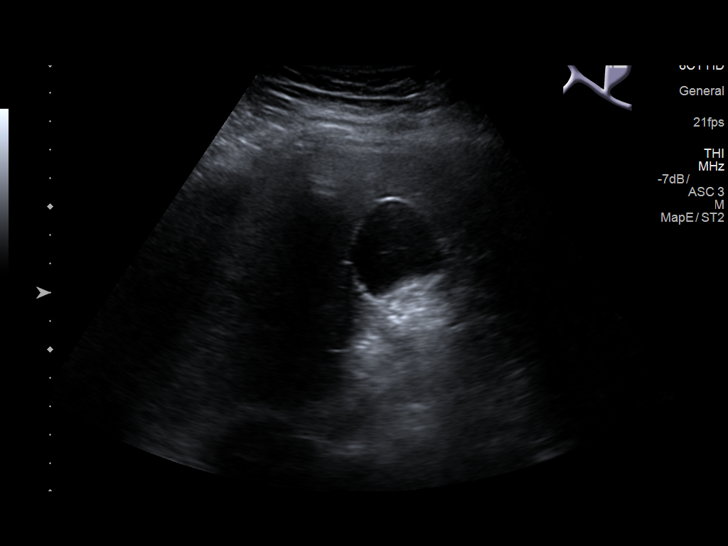
[im 15/27]
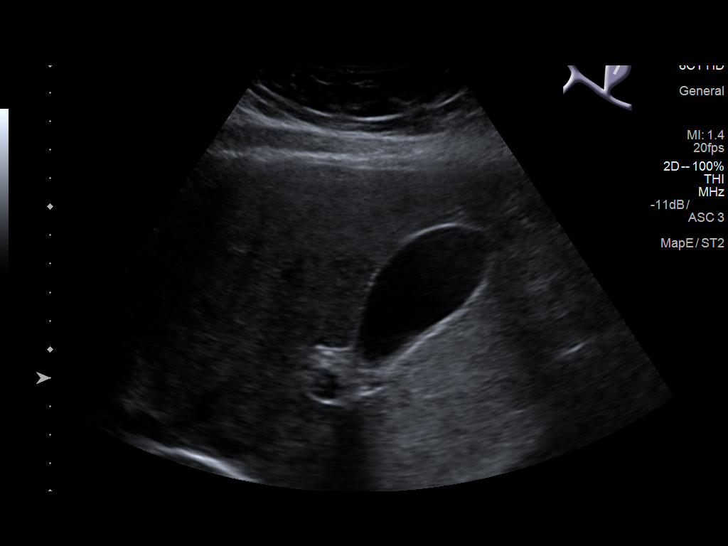
[im 17/27]
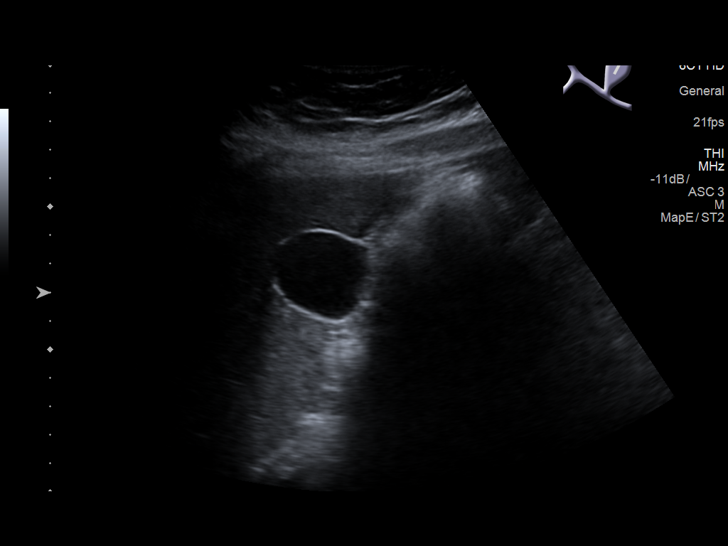
[im 18/27]
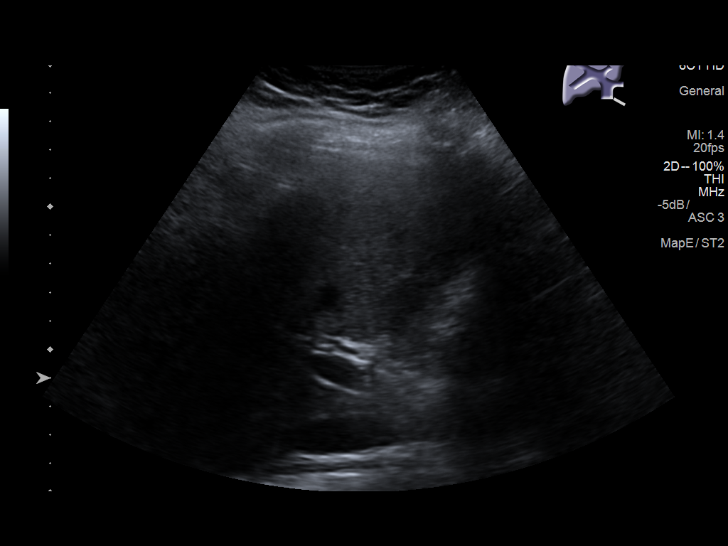
[im 20/27]
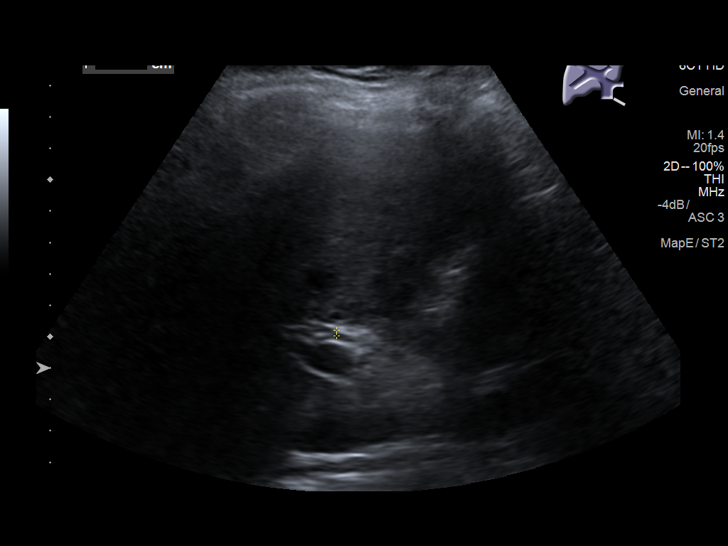
[im 22/27]
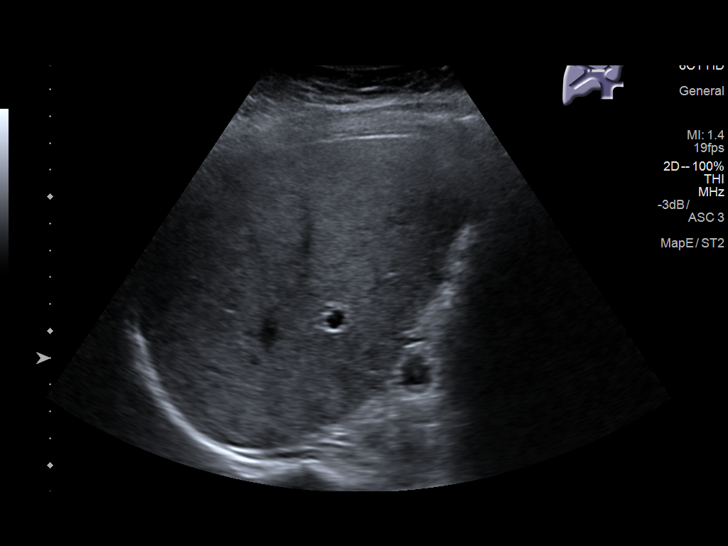
[im 24/27]
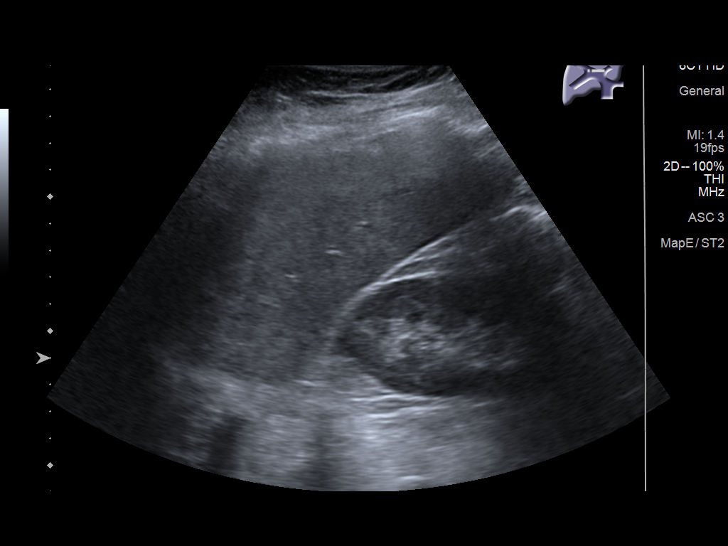
[im 27/27]
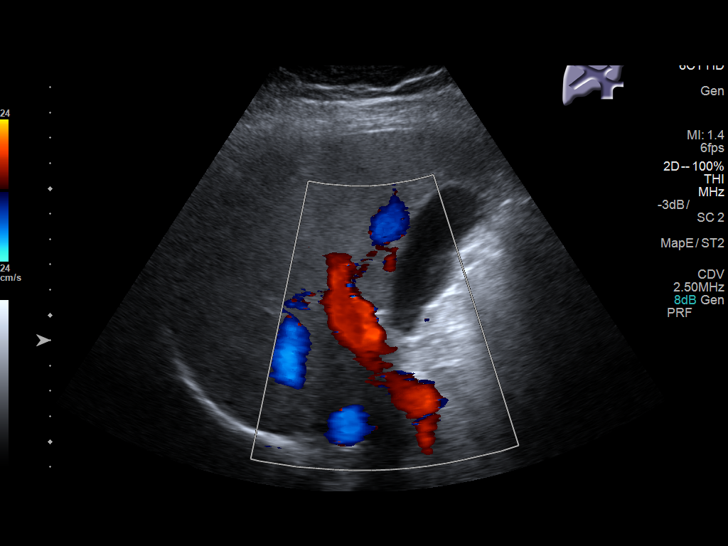

[14 of 25 positions shown; findings below may reference images not displayed]

FINDINGS: Gallbladder:

No gallstones or wall thickening visualized. No sonographic Murphy
sign noted by sonographer.

Common bile duct:

Diameter: 2 mm

Liver:

No focal lesion identified. Within normal limits in parenchymal
echogenicity. Portal vein is patent on color Doppler imaging with
normal direction of blood flow towards the liver.
IMPRESSION: Normal right upper quadrant ultrasound.

## 2018-02-05 ENCOUNTER — Encounter: Payer: Self-pay | Admitting: Adult Health

## 2018-02-07 ENCOUNTER — Ambulatory Visit: Payer: 59 | Admitting: Nurse Practitioner

## 2018-02-18 ENCOUNTER — Ambulatory Visit (INDEPENDENT_AMBULATORY_CARE_PROVIDER_SITE_OTHER): Payer: 59 | Admitting: Internal Medicine

## 2018-02-18 ENCOUNTER — Encounter: Payer: Self-pay | Admitting: Internal Medicine

## 2018-02-18 VITALS — BP 144/78 | HR 80 | Ht 68.0 in | Wt 215.8 lb

## 2018-02-18 DIAGNOSIS — H532 Diplopia: Secondary | ICD-10-CM | POA: Diagnosis not present

## 2018-02-18 DIAGNOSIS — H5789 Other specified disorders of eye and adnexa: Secondary | ICD-10-CM | POA: Insufficient documentation

## 2018-02-18 DIAGNOSIS — E079 Disorder of thyroid, unspecified: Secondary | ICD-10-CM

## 2018-02-18 DIAGNOSIS — E039 Hypothyroidism, unspecified: Secondary | ICD-10-CM | POA: Diagnosis not present

## 2018-02-18 DIAGNOSIS — E0500 Thyrotoxicosis with diffuse goiter without thyrotoxic crisis or storm: Secondary | ICD-10-CM | POA: Diagnosis not present

## 2018-02-18 LAB — T3, FREE: T3 FREE: 2.6 pg/mL (ref 2.3–4.2)

## 2018-02-18 LAB — TSH: TSH: 28.04 u[IU]/mL — ABNORMAL HIGH (ref 0.35–4.50)

## 2018-02-18 LAB — T4, FREE: FREE T4: 0.43 ng/dL — AB (ref 0.60–1.60)

## 2018-02-18 NOTE — Progress Notes (Addendum)
Patient ID: Tammy Gibson, female   DOB: Dec 29, 1975, 42 y.o.   MRN: [id number]    HPI  Tammy Gibson is a 42 y.o.-year-old female, referred by her PCP, Tammy Comber, NP, for management of uncontrolled hypothyroidism.  Pt. has been dx with hyperthyroidism (Graves ds likely) in 2000 (during investigation for weight loss: 190 >> 114 lbs) >> treated with MMI >> developed  hypothyroidism few years later.   She is now on LT4 200 mcg, last dose changed in 2017.  She takes the thyroid hormone: - fasting - with water - coffee + cream and sugar 1.5 hours later -she was drinking this with levothyroxine in the past - usually skips b'fast  - was on Tums starting 2-3h after LT4 - up to 11/2017, then stopped - + Protonix at night  - no iron,  multivitamins   I reviewed pt's thyroid tests - TSH almost always high:: Lab Results  Component Value Date   TSH 14.13 (H) 12/24/2017   TSH 20.40 (H) 10/24/2017   TSH 78.91 (H) 03/26/2017   TSH 93.84 (H) 09/17/2016   TSH 108.17 (H) 02/27/2016   TSH 88.737 (H) 08/23/2015   TSH 23.561 (H) 06/13/2015   TSH 105.118 (H) 05/10/2015   TSH 0.673 11/18/2014   TSH 12.496 (H) 08/17/2014   TSH 0.071 (L) 12/29/2013   TSH 0.285 (L) 11/18/2013   TSH 0.063 (L) 10/28/2013    No results found for: TSI   Pt mentions: - + weight gain, but more recently some Intentional weight loss  - started on Phentermine, stopped fried foods, bread - + fatigue - no cold intolerance - no anxiety/depression - + constipation/diarrhea - ++ dry skin - + hair loss  Pt denies feeling nodules in neck, hoarseness, dysphagia/odynophagia, SOB with lying down. She does mention mild discomfort in the low cervical anterior area.   She has + FH of thyroid disorders in: PGM, P aunts, cousin, sisters, Tammy Gibson , Tammy Gibson. No FH of thyroid cancer.  No h/o radiation tx to head or neck. No recent use of iodine supplements.  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  She has IBS with constipation.  ROS: Constitutional: + see HPI; also no subjective hyperthermia/hypothermia, + poor sleep Eyes: + blurry vision, + diplopia,  no xerophthalmia ENT: no sore throat, no nodules palpated in throat, no dysphagia/odynophagia, no hoarseness Cardiovascular: no CP/SOB/palpitations/+ leg swelling Respiratory: no cough/SOB Gastrointestinal: + all: N/V/D/C/acid reflux Musculoskeletal: no muscle/joint aches Skin: no rashes, + hair loss, + dry skin Neurological: no tremors/numbness/tingling/dizziness, + HA Psychiatric: no depression/anxiety + low libido  Past Medical History:  Diagnosis Date  . Abdominal pain   . Adenomyosis   . Bell's palsy   . Dysmenorrhea   . Dyspareunia   . Elevated LFTs   . Endometriosis   . Hyperlipidemia   . Infertility, female   . Thyroid disease    pt was hypo now hyperthyroid  . Urinary incontinence   . Vitamin D deficiency    Past Surgical History:  Procedure Laterality Date  . ECTOPIC PREGNANCY SURGERY  1997   RIGHT TUBE  . IUD REMOVAL  2013   mirena,only in for 6 months  . SALPINGECTOMY Right    Social History   Socioeconomic History  . Marital status: Married    Spouse name: Not on file  . Number of children: No  Social Needs  Occupational History  .  Operations lead  Tobacco Use  . Smoking status: Current Every Day Smoker    Packs/day: 0.25    Years: 30.00    Pack years: 7.50    Types: Cigarettes  . Smokeless tobacco: Never Used  . Tobacco comment: smokes about 2-3 cigarettes a day  Substance and Sexual Activity  . Alcohol use: No    Alcohol/week: 0.0 oz    Comment: Patient stopped alcohol due to elevated liver enzymes.  . Drug use: No  . Sexual activity: Yes    Partners: Male   Current Outpatient Medications on File Prior to Visit  Medication Sig Dispense Refill  . Cholecalciferol (VITAMIN D3) 10000 UNITS capsule Take 10,000 Units by mouth daily.    Marland Kitchen  glycopyrrolate (ROBINUL) 1 MG tablet Take 1 tablet (1 mg total) by mouth 2 (two) times daily as needed. 60 tablet 3  . ibuprofen (ADVIL,MOTRIN) 200 MG tablet Take 600 mg by mouth every 6 (six) hours as needed for moderate pain or cramping.    Marland Kitchen levothyroxine (SYNTHROID, LEVOTHROID) 200 MCG tablet TAKE 1 TABLET (200 MCG TOTAL) BY MOUTH DAILY. 90 tablet 1  . pantoprazole (PROTONIX) 40 MG tablet Take 1 tablet (40 mg total) by mouth 2 (two) times daily before a meal. 60 tablet 2  . triamcinolone ointment (KENALOG) 0.1 % Apply 1 application 2 (two) times daily topically. 80 g 1  . dicyclomine (BENTYL) 20 MG tablet Take 1 tablet (20 mg total) by mouth 4 (four) times daily -  before meals and at bedtime. 90 tablet 1   No current facility-administered medications on file prior to visit.    No Known Allergies Family History  Problem Relation Age of Onset  . Hypertension Father   . Hyperlipidemia Father   . Diabetes Father   . Thyroid disease Father   . Thyroid disease Mother   . Breast cancer Maternal Aunt   . Thyroid disease Sister   . Cancer - Colon Paternal Grandfather   . Thyroid disease Paternal Grandmother        Hashimoto's    PE: BP (!) 144/78   Pulse 80   Ht 5\' 8"  (1.727 m)   Wt 215 lb 12.8 oz (97.9 kg)   SpO2 95%   BMI 32.81 kg/m  Wt Readings from Last 3 Encounters:  02/18/18 215 lb 12.8 oz (97.9 kg)  12/25/17 218 lb 12.8 oz (99.2 kg)  12/24/17 217 lb (98.4 kg)   Constitutional: overweight, in NAD Eyes: PERRLA, EOMI, + right exophthalmos, without lid lag, and with mild stare ENT: moist mucous membranes, + symmetric mild thyromegaly, no cervical lymphadenopathy Cardiovascular: RRR, No MRG Respiratory: CTA B Gastrointestinal: abdomen soft, NT, ND, BS+ Musculoskeletal: no deformities, strength intact in all 4 Skin: moist, warm, no rashes Neurological: no tremor with outstretched hands, DTR normal in all 4  ASSESSMENT: 1. Hypothyroidism - After resolution of  hyperthyroidism (most likely Graves' disease).  2.Thyroid eye disease  PLAN:  1. Patient with long-standing uncontrolled hypothyroidism, on levothyroxine therapy.  She developed hypothyroidism while on methimazole for treating her hyperthyroidism.  She was started on levothyroxine, but her tests were never controlled on this medication.  She is not on a very high dose, 200 mcg daily, which she is taking every day. - she appears euthyroid, but does have several complaints indicative of hypothyroidism: Weight gain, fatigue, very dry skin - she does not appear to have a goiter, thyroid nodules, or neck compression symptoms, but does feel some discomfort in the thyroid  area of her neck and we discussed that this could be secondary to the persistently elevated TSH that stimulates the thyroid gland to grow. - We discussed about correct intake of levothyroxine, fasting, with water, separated by at least 30 minutes from breakfast, and separated by more than 4 hours from calcium, iron, multivitamins, acid reflux medications (PPIs).  She is taking this correctly now, up to ~2 months ago, she was taking it closer to calcium (Tums).  She stopped calcium.  She does take Protonix at night.   - We discussed that we have several options if the TFTs today are still abnormal: 1. To start Tirosint, liquid levothyroxine, which is absorbed much better than the tablet form.  I gave her a coupon card for this in case we decide to start with this. 2. To use Synthroid brand name 3. To continue levothyroxine generic with increase the dose as needed to get her TSH to normal - For now, we will try Tirosint after the results return - will check thyroid tests today: TSH, free T4 - If labs today are abnormal, she will need to return in ~6 weeks for repeat labs - Otherwise, I will see her back in 4 months  2.Thyroid eye disease -  She is seeing ophthalmology at [facility]. - I reviewed Dr. Earlie Server notes from 01/07/2018 >> mild  proptosis, normal visual fields and normal OCT of the retinal nerve.  She was referred to see a strabismus specialist. - For now, she uses prism glasses - At today's visit, we will check her TSI antibodies  and I advised her to let her ophthalmologist know about her titer  Component     Latest Ref Rng & Units 02/18/2018  TSH     0.35 - 4.50 uIU/mL 28.04 (H)  T4,Free(Direct)     0.60 - 1.60 ng/dL 0.43 (L)  Triiodothyronine,Free,Serum     2.3 - 4.2 pg/mL 2.6   TSH is still quite elevated.  Will start Tirosint 200 mcg daily.  We will have her come back for labs in 1.5 months.   Lab Results  Component Value Date   TSI 348 (H) 02/18/2018   TSI is elevated.  I will advised her to relate this information to her ophthalmologist. Philemon Kingdom, MD PhD [facility]

## 2018-02-18 NOTE — Patient Instructions (Signed)
Please continue Levothyroxine 2 x100 mcg daily.  Take the thyroid hormone every day, with water, at least 30 minutes before breakfast, separated by at least 4 hours from: - acid reflux medications - calcium - iron - multivitamins  Please come back for a follow-up appointment in 4 months.

## 2018-02-20 LAB — THYROID STIMULATING IMMUNOGLOBULIN: TSI: 348 %{baseline} — AB (ref <140)

## 2018-02-20 MED ORDER — TIROSINT 200 MCG PO CAPS: 200.0000 ug | ORAL_CAPSULE | Freq: Every day | ORAL | 3 refills | Status: DC

## 2018-02-24 ENCOUNTER — Other Ambulatory Visit (INDEPENDENT_AMBULATORY_CARE_PROVIDER_SITE_OTHER): Payer: 59

## 2018-02-24 ENCOUNTER — Encounter: Payer: Self-pay | Admitting: Nurse Practitioner

## 2018-02-24 ENCOUNTER — Ambulatory Visit (INDEPENDENT_AMBULATORY_CARE_PROVIDER_SITE_OTHER): Payer: 59 | Admitting: Nurse Practitioner

## 2018-02-24 VITALS — BP 110/70 | HR 93 | Ht 68.0 in | Wt 214.0 lb

## 2018-02-24 DIAGNOSIS — K625 Hemorrhage of anus and rectum: Secondary | ICD-10-CM

## 2018-02-24 DIAGNOSIS — R194 Change in bowel habit: Secondary | ICD-10-CM

## 2018-02-24 DIAGNOSIS — R109 Unspecified abdominal pain: Secondary | ICD-10-CM

## 2018-02-24 LAB — CBC WITH DIFFERENTIAL/PLATELET
Basophils Absolute: 0.1 10*3/uL (ref 0.0–0.1)
Basophils Relative: 0.8 % (ref 0.0–3.0)
Eosinophils Absolute: 0.5 10*3/uL (ref 0.0–0.7)
Eosinophils Relative: 3.8 % (ref 0.0–5.0)
HEMATOCRIT: 44.6 % (ref 36.0–46.0)
HEMOGLOBIN: 14.9 g/dL (ref 12.0–15.0)
Lymphocytes Relative: 28.8 % (ref 12.0–46.0)
Lymphs Abs: 3.7 10*3/uL (ref 0.7–4.0)
MCHC: 33.4 g/dL (ref 30.0–36.0)
MCV: 86.2 fl (ref 78.0–100.0)
MONO ABS: 0.8 10*3/uL (ref 0.1–1.0)
Monocytes Relative: 6 % (ref 3.0–12.0)
Neutro Abs: 7.8 10*3/uL — ABNORMAL HIGH (ref 1.4–7.7)
Neutrophils Relative %: 60.6 % (ref 43.0–77.0)
Platelets: 305 10*3/uL (ref 150.0–400.0)
RBC: 5.18 Mil/uL — ABNORMAL HIGH (ref 3.87–5.11)
RDW: 14.7 % (ref 11.5–15.5)
WBC: 12.8 10*3/uL — AB (ref 4.0–10.5)

## 2018-02-24 MED ORDER — PEG-KCL-NACL-NASULF-NA ASC-C 140 G PO SOLR: 1.0000 | ORAL | 0 refills | Status: DC

## 2018-02-24 NOTE — Progress Notes (Addendum)
IMPRESSION and PLAN:    #1. 42 yo old female with chronic upper and lower GI symptoms. She has GERD but asymptomatic since change PPI med to protonix. She was having epigastric pain when we saw her in January, this too has resolved. She has chronic, intermittent loose stools which generally occur after days without a BM. Loose stools associated with lower abdominal crampy pain. This too had improved after addition of a bowel antispasmodic in January.   #2. Recurrent loose stool with cramps. Says it was much worse than previous episodes and this time she has rectal bleeding. Empirically using hemorrhoid ointment for bleeding. The bleeding hasn't totally stopped and still having some cramping. This could be ischemic colitis though pain doesn't usually linger. Bleeding could be hemorrhoidal.  -I think at this point we should pursue colonoscopic workup. Patient will be scheduled for a  colonoscopy with possible polypectomy.  The risks and benefits of the procedure were discussed and the patient agrees to proceed.  -continue hemorrhoid treatment for total of 7 days -cbc today.   #3.  History of mild transaminitis.  Normal transaminases at last check November 2018.  Fatty liver by ultrasound.  Viral hepatitis studies negative.   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   Addendum: Reviewed and agree with  management. Jerene Bears, MD        HPI:    Chief Complaint: abdominal pain, bloating, nausea / vomiting   Patient is a 42 year old female known to Dr. Hilarie Fredrickson and saw him in early January of this year for altered bowel habits, bloating, nausea / vomiting and epigastric pain. She describes bowel pattern as cycles consisting of no BM for days then a "blow out" with loose stool. At January visit we changed her PPI to pantoprazole 40 mg twice daily before meals.  Carafate was continued and we added Glycopyrate but she is actually  taking Bentyl.   She is here for follow-up as requested   The regimen we put her on has helped and she did well until this Friday. No further heartburn /  epigastric pain but around 1 30 am Sat she woke up with lower abdominal pain and urge to defecate. She had a normal BM, went back to bed but awoke shortly thereafter with diarrhea, nausea / vomiting. This went on for several hours but after the first few diarrheal stools she began just passing blood. Didn't check temp but had some sweats. She had rectal bleeding once before, seen in ED and diagnosed with hemorrhoids. Given this, patient started OTC hemorrhoid cream on Saturday. She is still passing some blood, still having some lower cramping but no further diarrhea. No recent antibiotics.   Review of systems:   No chest pain. No dizziness or SOB.     Past Medical History:  Diagnosis Date  . Adenomyosis   . Bell's palsy   . Dysmenorrhea   . Dyspareunia   . Elevated LFTs   . Endometriosis   . Hyperlipidemia   . Infertility, female   . Thyroid disease    pt was hypo now hyperthyroid  . Urinary incontinence   . Vitamin D deficiency     Patient's surgical history, family medical history, social history, medications and allergies were all reviewed in Epic    Physical Exam:     BP 110/70   Pulse 93   Ht 5\' 8"  (1.727 m)   Wt 214 lb (97.1  kg)   BMI 32.54 kg/m   GENERAL: white female in NAD PSYCH: :Pleasant, cooperative, normal affect EENT:  conjunctiva pink, mucous membranes moist, neck supple without masses CARDIAC:  RRR, no murmur heard, no peripheral edema PULM: Normal respiratory effort, lungs CTA bilaterally, no wheezing ABDOMEN:  Nondistended, soft, nontender. No obvious masses, no hepatomegaly,  normal bowel sounds SKIN:  turgor, no lesions seen Musculoskeletal:  Normal muscle tone, normal strength NEURO: Alert and oriented x 3, no focal neurologic deficits   Tye Savoy , NP 02/24/2018, 2:34 PM

## 2018-02-24 NOTE — Patient Instructions (Addendum)
If you are age 43 or older, your body mass index should be between 23-30. Your Body mass index is 32.54 kg/m. If this is out of the aforementioned range listed, please consider follow up with your Primary Care Provider.  If you are age 60 or younger, your body mass index should be between 19-25. Your Body mass index is 32.54 kg/m. If this is out of the aformentioned range listed, please consider follow up with your Primary Care Provider.   You have been scheduled for a colonoscopy. Please follow written instructions given to you at your visit today.  Please pick up your prep supplies at the pharmacy within the next 1-3 days. If you use inhalers (even only as needed), please bring them with you on the day of your procedure. Your physician has requested that you go to www.[email or web address] and enter the access code given to you at your visit today. This web site gives a general overview about your procedure. However, you should still follow specific instructions given to you by our office regarding your preparation for the procedure.  We have sent the following medications to your pharmacy for you to pick up at your convenience: Plenvu  Your physician has requested that you go to the basement for the following lab work before leaving today: CBC w/Diff  Continue Steroid cream in rectum daily for a total of 7 days.  Thank you for choosing me and [facility].   Tye Savoy, NP

## 2018-02-26 ENCOUNTER — Encounter: Payer: Self-pay | Admitting: Nurse Practitioner

## 2018-02-26 ENCOUNTER — Telehealth: Payer: Self-pay

## 2018-02-26 NOTE — Telephone Encounter (Signed)
Tammy Gibson, let us try a preauthorization for her, since she was not controlled on other forms of levothyroxine in the past.

## 2018-02-26 NOTE — Telephone Encounter (Signed)
Ok

## 2018-02-26 NOTE — Telephone Encounter (Signed)
Received paper Rx request from pharmacy requesting alternative for TIROSINT 200 MCG CAPS. No longer covered by insurance. Please advise. Thanks.

## 2018-02-27 ENCOUNTER — Telehealth: Payer: Self-pay | Admitting: Nurse Practitioner

## 2018-02-27 NOTE — Telephone Encounter (Signed)
Left the patient a message that her labs have not been reviewed by her provider. I did tell her the hgb is normal.

## 2018-03-03 ENCOUNTER — Telehealth: Payer: Self-pay | Admitting: Internal Medicine

## 2018-03-03 NOTE — Telephone Encounter (Signed)
Patient wants you to call her at ph# [phone] re:PA

## 2018-03-03 NOTE — Telephone Encounter (Signed)
Called [employer] to initiate PA for Tirosint 260mcg caps. [employer] could not locate patient in system. Called patient to have her call in and check demographic status w/ insuarnce. Patient will c/b.

## 2018-03-04 NOTE — Telephone Encounter (Signed)
Spoke to patient on yesterday. Advised insurance unable to id her as active. Pt called ins, called me back and provided group# to give when calling in for PA initiation.

## 2018-03-05 NOTE — Telephone Encounter (Signed)
Called insurance to check status of PA.  Patient called in and updated info. Started YB#[id number]. To be reviewed in 24 hours.

## 2018-03-10 NOTE — Telephone Encounter (Signed)
PA for Tirosint approved. Approval dates: 03/05/2018-03/06/2019 Patient aware.

## 2018-03-13 ENCOUNTER — Encounter: Payer: Self-pay | Admitting: Internal Medicine

## 2018-03-25 ENCOUNTER — Telehealth: Payer: Self-pay | Admitting: Internal Medicine

## 2018-03-26 ENCOUNTER — Encounter: Payer: 59 | Admitting: Internal Medicine

## 2018-03-26 ENCOUNTER — Other Ambulatory Visit: Payer: Self-pay | Admitting: Internal Medicine

## 2018-04-21 IMAGING — DX DG CHEST 2V
2 series · 2 of 2 positions shown · non-contrast
Comparison: Chest radiograph dated 12/29/2013

CLINICAL DATA: 41-year-old female with epigastric pain.

EXAM:
CHEST  2 VIEW

[chest pa]
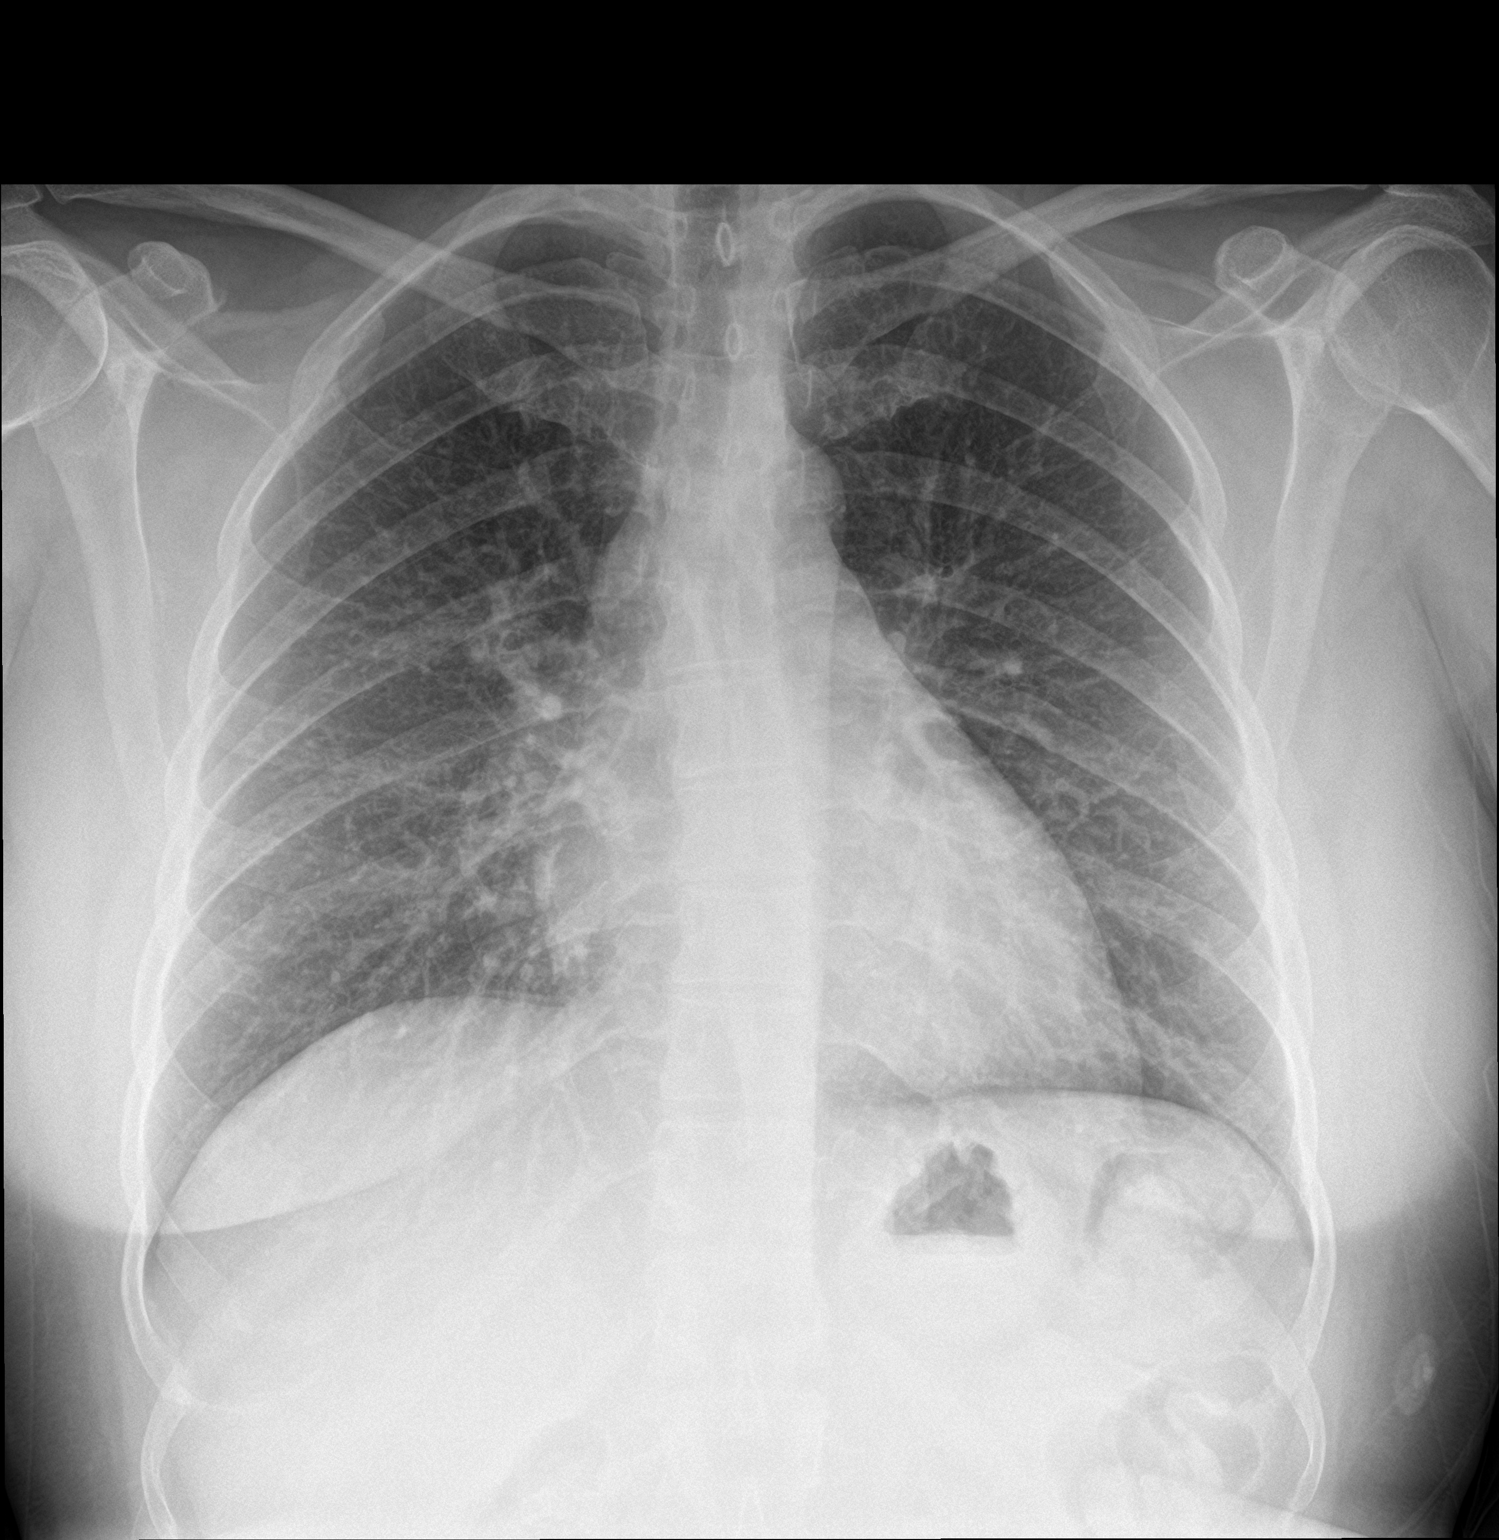

[chest lat]
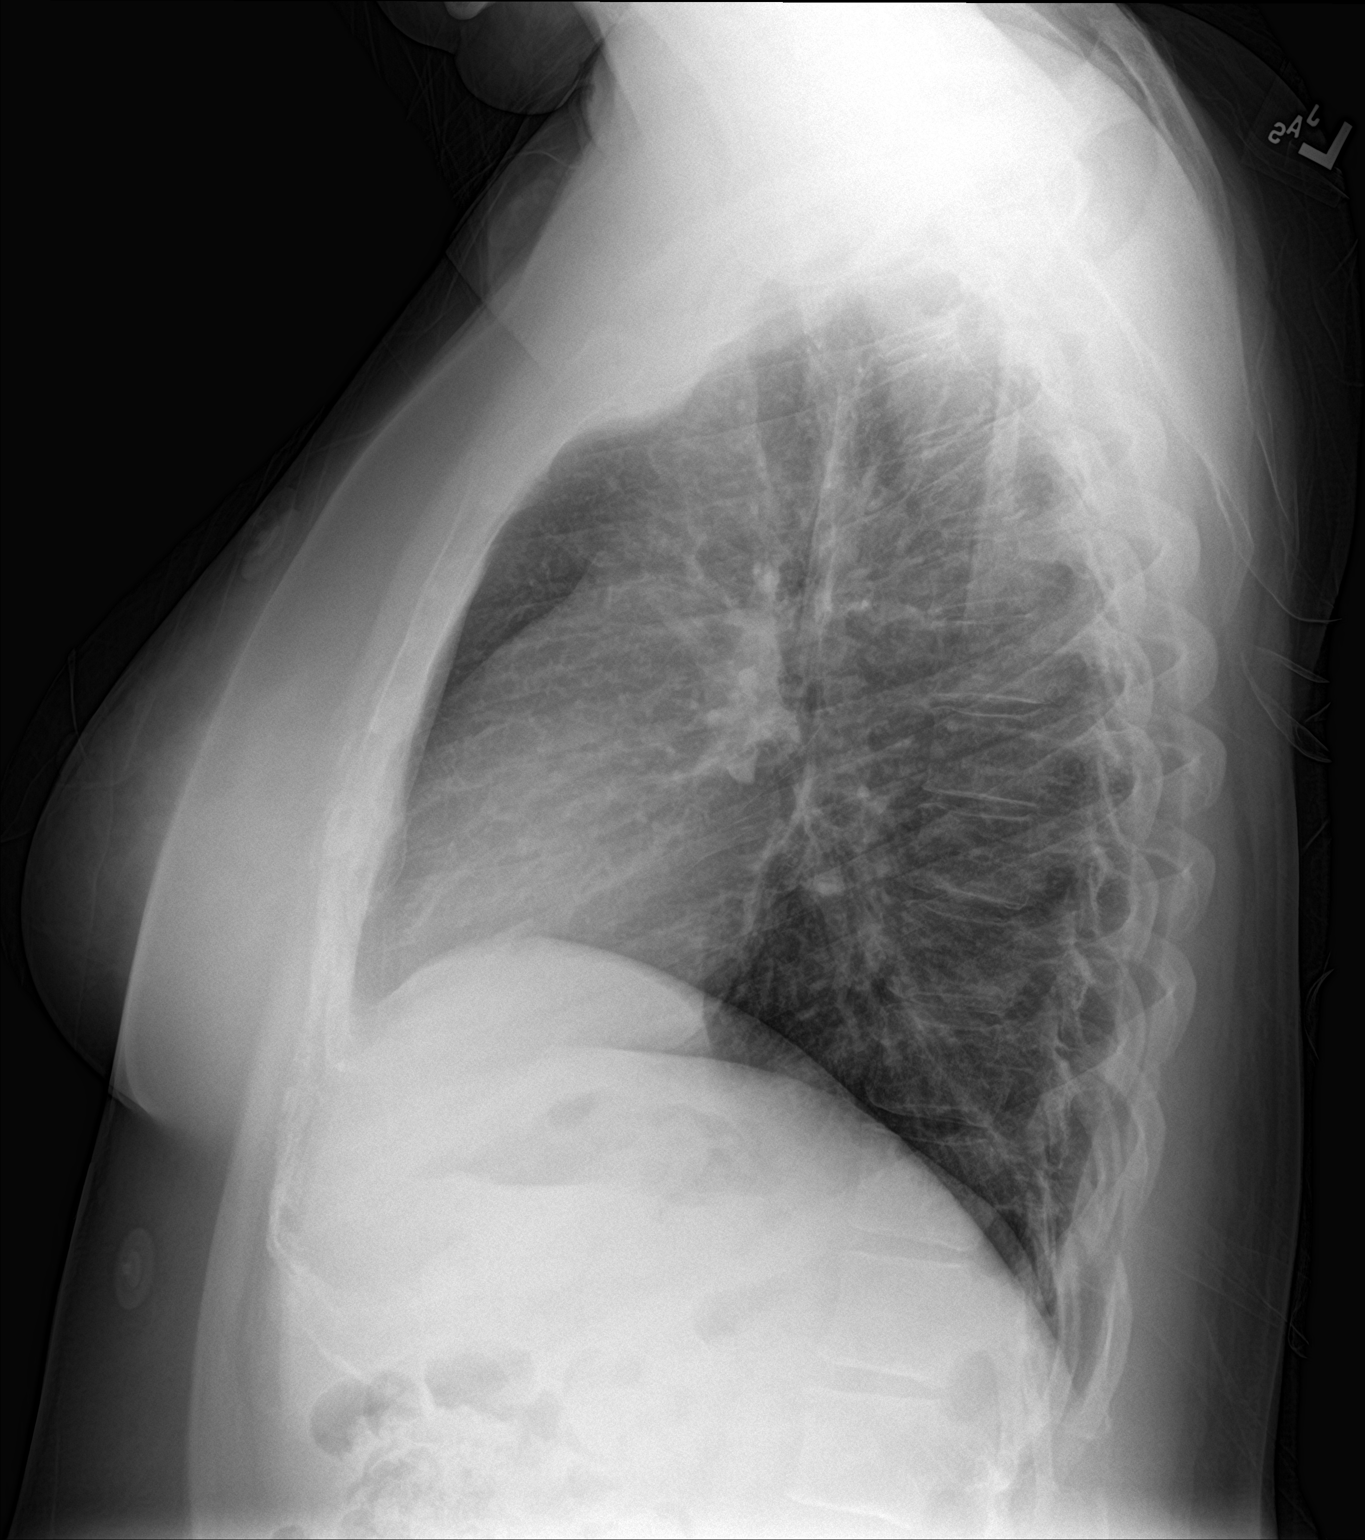

[2 of 2 positions shown; findings below may reference images not displayed]

FINDINGS: The heart size and mediastinal contours are within normal limits.
Both lungs are clear. The visualized skeletal structures are
unremarkable.
IMPRESSION: No active cardiopulmonary disease.

## 2018-04-23 NOTE — Progress Notes (Signed)
FOLLOW UP  Assessment and Plan:   Hypertension Well controlled with current medications  Monitor blood pressure at home; patient to call if consistently greater than 130/80 Continue DASH diet.   Reminder to go to the ER if any CP, SOB, nausea, dizziness, severe HA, changes vision/speech, left arm numbness and tingling and jaw pain.  Cholesterol Currently above goal; discussed will likely initiate statin medication pending results tomorrow Continue low cholesterol diet and exercise.  Check lipid panel.   Other abnormal gluocse Recent A1Cs at goal Discussed diet/exercise, weight management  Defer A1C; check BMP  Obesity with co morbidities Long discussion about weight loss, diet, and exercise Recommended diet heavy in fruits and veggies and low in animal meats, cheeses, and dairy products, appropriate calorie intake Discussed ideal weight for height  Will follow up in 3 months  Hypothyroidism continue medications the same pending lab results reminded to take on an empty stomach 30-58mns before food.  check TSH level Will forward to Dr. GCruzita Lederer Vitamin D Def Below goal at last visit; continue supplementation to maintain goal of 70-100 Check Vit D level  Insomnia Insomnia- good sleep hygiene discussed, increase day time activity Has failed melatonin and benadryl Will send in trazodone 50 mg - start with 25 mg at night for sleep and increase to 50 mg if needed in 2 weeks. Risks and SE discussed and provided on AVS.   Continue diet and meds as discussed. Further disposition pending results of labs. Discussed med's effects and SE's.   Over 30 minutes of exam, counseling, chart review, and critical decision making was performed.   Future Appointments  Date Time Provider DSingac 05/07/2018 11:00 AM Pyrtle, JLajuan Lines MD [facility]  06/24/2018  4:00 PM GPhilemon Kingdom MD [facility] None  10/27/2018  9:00 AM CLiane Comber NP [facility] None     ----------------------------------------------------------------------------------------------------------------------  HPI 42y.o. female  presents for 3 month follow up on hypertension, cholesterol, glucose management, obesity, hypothyroid and vitamin D deficiency.   She reports having difficulty sleeping, not sleeping more than 2-4 hours a night due to difficulty staying asleep. She reports she has tried melatonin and benadryl without much benefit.   BMI is Body mass index is 33.72 kg/m., she has not been working on diet and exercise. Wt Readings from Last 3 Encounters:  04/24/18 221 lb 12.8 oz (100.6 kg)  02/24/18 214 lb (97.1 kg)  02/18/18 215 lb 12.8 oz (97.9 kg)   Her blood pressure has been controlled at home, today their BP is BP: 116/76  She does not workout. She denies chest pain, shortness of breath, dizziness.   She is not on cholesterol medication. Her cholesterol is not at goal. The cholesterol last visit was:   Lab Results  Component Value Date   CHOL 265 (H) 10/24/2017   HDL 53 10/24/2017   LDLCALC 188 (H) 10/24/2017   TRIG 111 10/24/2017   CHOLHDL 5.0 (H) 10/24/2017    She has not been working on diet and exercise for glucose management (hx of prediabetes), and denies foot ulcerations, increased appetite, nausea, paresthesia of the feet, polydipsia, polyuria, visual disturbances, vomiting and weight loss. Last A1C in the office was:  Lab Results  Component Value Date   HGBA1C 5.5 10/24/2017   She is on thyroid medication. She has been very difficult to manage in the past with TSH's consistently running high, and she was finally referred to Dr. GCruzita Ledererfor evaluation and was switched from levothyroxine to liquid form  tirosint 200 mcg. 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  Lab Results  Component Value Date   TSH 28.04  (H) 02/18/2018   Patient is on Vitamin D supplement, taking 10000 IU every other day:   Lab Results  Component Value Date   VD25OH 30 10/24/2017        Current Medications:  Current Outpatient Medications on File Prior to Visit  Medication Sig  . Acetaminophen (TYLENOL) 325 MG CAPS Take by mouth.  . Cholecalciferol (VITAMIN D3) 10000 UNITS capsule Take 10,000 Units by mouth daily.  Marland Kitchen dicyclomine (BENTYL) 20 MG tablet Take 1 tablet (20 mg total) by mouth 4 (four) times daily -  before meals and at bedtime.  Marland Kitchen glycopyrrolate (ROBINUL) 1 MG tablet TAKE 1 TABLET (1 MG TOTAL) BY MOUTH 2 (TWO) TIMES DAILY AS NEEDED.  Marland Kitchen OVER THE COUNTER MEDICATION Hemorrhoid cream daily  . pantoprazole (PROTONIX) 40 MG tablet TAKE 1 TABLET (40 MG TOTAL) BY MOUTH 2 (TWO) TIMES DAILY BEFORE A MEAL.  Marland Kitchen PEG-KCl-NaCl-NaSulf-Na Asc-C (PLENVU) 140 g SOLR Take 1 kit by mouth as directed.  Marland Kitchen TIROSINT 200 MCG CAPS Take 200 mcg by mouth daily.  Marland Kitchen triamcinolone ointment (KENALOG) 0.1 % Apply 1 application 2 (two) times daily topically.   No current facility-administered medications on file prior to visit.      Allergies: No Known Allergies   Medical History:  Past Medical History:  Diagnosis Date  . Abdominal pain   . Adenomyosis   . Bell's palsy   . Dysmenorrhea   . Dyspareunia   . Elevated LFTs   . Endometriosis   . Hyperlipidemia   . Infertility, female   . Thyroid disease    pt was hypo now hyperthyroid  . Urinary incontinence   . Vitamin D deficiency    Family history- Reviewed and unchanged Social history- Reviewed and unchanged   Review of Systems:  Review of Systems  Constitutional: Negative for malaise/fatigue and weight loss.  HENT: Negative for hearing loss and tinnitus.   Eyes: Positive for blurred vision. Negative for double vision.  Respiratory: Negative for cough, shortness of breath and wheezing.   Cardiovascular: Negative for chest pain, palpitations, orthopnea, claudication and  leg swelling.  Gastrointestinal: Negative for abdominal pain, blood in stool, constipation, diarrhea, heartburn, melena, nausea and vomiting.  Genitourinary: Negative.   Musculoskeletal: Negative for joint pain and myalgias.  Skin: Negative for rash.  Neurological: Negative for dizziness, tingling, sensory change, weakness and headaches.  Endo/Heme/Allergies: Negative for polydipsia.  Psychiatric/Behavioral: Negative for depression, hallucinations, memory loss, substance abuse and suicidal ideas. The patient has insomnia. The patient is not nervous/anxious.   All other systems reviewed and are negative.   Physical Exam: BP 116/76   Pulse 87   Temp (!) 97.5 F (36.4 C)   Ht '5\' 8"'  (1.727 m)   Wt 221 lb 12.8 oz (100.6 kg)   SpO2 97%   BMI 33.72 kg/m  Wt Readings from Last 3 Encounters:  04/24/18 221 lb 12.8 oz (100.6 kg)  02/24/18 214 lb (97.1 kg)  02/18/18 215 lb 12.8 oz (97.9 kg)   General Appearance: Well nourished, in no apparent distress. Eyes: PERRLA, EOMs, conjunctiva no swelling or erythema. Right eye with mild anterior protrusion.  Sinuses: No Frontal/maxillary tenderness ENT/Mouth: Ext aud canals clear, TMs without erythema, bulging. No erythema, swelling, or exudate on post pharynx.  Tonsils not swollen or erythematous. Hearing normal.  Neck: Supple, thyroid normal without palpable nodules.  Respiratory: Respiratory effort normal, BS equal bilaterally without rales, rhonchi, wheezing or stridor.  Cardio: RRR with no MRGs. Brisk peripheral pulses without edema.  Abdomen: Soft, + BS.  Non tender, no guarding, rebound, hernias, masses. Lymphatics: Non tender without lymphadenopathy.  Musculoskeletal: Full ROM, 5/5 strength, Normal gait Skin: Warm, dry without rashes, lesions, ecchymosis.  Neuro: Cranial nerves intact. No cerebellar symptoms.  Psych: Awake and oriented X 3, normal affect, Insight and Judgment appropriate.    Izora Ribas, NP 9:04 AM [facility]

## 2018-04-24 ENCOUNTER — Encounter: Payer: Self-pay | Admitting: Adult Health

## 2018-04-24 ENCOUNTER — Ambulatory Visit (INDEPENDENT_AMBULATORY_CARE_PROVIDER_SITE_OTHER): Payer: 59 | Admitting: Adult Health

## 2018-04-24 VITALS — BP 116/76 | HR 87 | Temp 97.5°F | Ht 68.0 in | Wt 221.8 lb

## 2018-04-24 DIAGNOSIS — E782 Mixed hyperlipidemia: Secondary | ICD-10-CM

## 2018-04-24 DIAGNOSIS — E669 Obesity, unspecified: Secondary | ICD-10-CM | POA: Diagnosis not present

## 2018-04-24 DIAGNOSIS — E0500 Thyrotoxicosis with diffuse goiter without thyrotoxic crisis or storm: Secondary | ICD-10-CM | POA: Diagnosis not present

## 2018-04-24 DIAGNOSIS — I10 Essential (primary) hypertension: Secondary | ICD-10-CM

## 2018-04-24 DIAGNOSIS — N182 Chronic kidney disease, stage 2 (mild): Secondary | ICD-10-CM | POA: Diagnosis not present

## 2018-04-24 DIAGNOSIS — E559 Vitamin D deficiency, unspecified: Secondary | ICD-10-CM | POA: Diagnosis not present

## 2018-04-24 DIAGNOSIS — E039 Hypothyroidism, unspecified: Secondary | ICD-10-CM | POA: Diagnosis not present

## 2018-04-24 DIAGNOSIS — Z8639 Personal history of other endocrine, nutritional and metabolic disease: Secondary | ICD-10-CM | POA: Insufficient documentation

## 2018-04-24 DIAGNOSIS — G4700 Insomnia, unspecified: Secondary | ICD-10-CM | POA: Diagnosis not present

## 2018-04-24 DIAGNOSIS — Z79899 Other long term (current) drug therapy: Secondary | ICD-10-CM

## 2018-04-24 MED ORDER — TRAZODONE HCL 50 MG PO TABS: ORAL_TABLET | ORAL | 1 refills | Status: DC

## 2018-04-24 NOTE — Patient Instructions (Signed)
I am sending in trazodone to help with sleep - start by taking 1/2 tab (25 mg) 1-2 hours before bedtime to help with sleep - can increase to full tab (50 mg) if needed in 1-2 weeks.    Trazodone tablets What is this medicine? TRAZODONE (TRAZ oh done) is used to treat depression. This medicine may be used for other purposes; ask your health care provider or pharmacist if you have questions. COMMON BRAND NAME(S): Desyrel What should I tell my health care provider before I take this medicine? They need to know if you have any of these conditions: -attempted suicide or thinking about it -bipolar disorder -bleeding problems -glaucoma -heart disease, or previous heart attack -irregular heart beat -kidney or liver disease -low levels of sodium in the blood -an unusual or allergic reaction to trazodone, other medicines, foods, dyes or preservatives -pregnant or trying to get pregnant -breast-feeding How should I use this medicine? Take this medicine by mouth with a glass of water. Follow the directions on the prescription label. Take this medicine shortly after a meal or a light snack. Take your medicine at regular intervals. Do not take your medicine more often than directed. Do not stop taking this medicine suddenly except upon the advice of your doctor. Stopping this medicine too quickly may cause serious side effects or your condition may worsen. A special MedGuide will be given to you by the pharmacist with each prescription and refill. Be sure to read this information carefully each time. Talk to your pediatrician regarding the use of this medicine in children. Special care may be needed. Overdosage: If you think you have taken too much of this medicine contact a poison control center or emergency room at once. NOTE: This medicine is only for you. Do not share this medicine with others. What if I miss a dose? If you miss a dose, take it as soon as you can. If it is almost time for your next  dose, take only that dose. Do not take double or extra doses. What may interact with this medicine? Do not take this medicine with any of the following medications: -certain medicines for fungal infections like fluconazole, itraconazole, ketoconazole, posaconazole, voriconazole -cisapride -dofetilide -dronedarone -linezolid -MAOIs like Carbex, Eldepryl, Marplan, Nardil, and Parnate -mesoridazine -methylene blue (injected into a vein) -pimozide -saquinavir -thioridazine -ziprasidone This medicine may also interact with the following medications: -alcohol -antiviral medicines for HIV or AIDS -aspirin and aspirin-like medicines -barbiturates like phenobarbital -certain medicines for blood pressure, heart disease, irregular heart beat -certain medicines for depression, anxiety, or psychotic disturbances -certain medicines for migraine headache like almotriptan, eletriptan, frovatriptan, naratriptan, rizatriptan, sumatriptan, zolmitriptan -certain medicines for seizures like carbamazepine and phenytoin -certain medicines for sleep -certain medicines that treat or prevent blood clots like dalteparin, enoxaparin, warfarin -digoxin -fentanyl -lithium -NSAIDS, medicines for pain and inflammation, like ibuprofen or naproxen -other medicines that prolong the QT interval (cause an abnormal heart rhythm) -rasagiline -supplements like St. John's wort, kava kava, valerian -tramadol -tryptophan This list may not describe all possible interactions. Give your health care provider a list of all the medicines, herbs, non-prescription drugs, or dietary supplements you use. Also tell them if you smoke, drink alcohol, or use illegal drugs. Some items may interact with your medicine. What should I watch for while using this medicine? Tell your doctor if your symptoms do not get better or if they get worse. Visit your doctor or health care professional for regular checks on your progress. Because it may  take several weeks to see the full effects of this medicine, it is important to continue your treatment as prescribed by your doctor. Patients and their families should watch out for new or worsening thoughts of suicide or depression. Also watch out for sudden changes in feelings such as feeling anxious, agitated, panicky, irritable, hostile, aggressive, impulsive, severely restless, overly excited and hyperactive, or not being able to sleep. If this happens, especially at the beginning of treatment or after a change in dose, call your health care professional. Dennis Bast may get drowsy or dizzy. Do not drive, use machinery, or do anything that needs mental alertness until you know how this medicine affects you. Do not stand or sit up quickly, especially if you are an older patient. This reduces the risk of dizzy or fainting spells. Alcohol may interfere with the effect of this medicine. Avoid alcoholic drinks. This medicine may cause dry eyes and blurred vision. If you wear contact lenses you may feel some discomfort. Lubricating drops may help. See your eye doctor if the problem does not go away or is severe. Your mouth may get dry. Chewing sugarless gum, sucking hard candy and drinking plenty of water may help. Contact your doctor if the problem does not go away or is severe. What side effects may I notice from receiving this medicine? Side effects that you should report to your doctor or health care professional as soon as possible: -allergic reactions like skin rash, itching or hives, swelling of the face, lips, or tongue -elevated mood, decreased need for sleep, racing thoughts, impulsive behavior -confusion -fast, irregular heartbeat -feeling faint or lightheaded, falls -feeling agitated, angry, or irritable -loss of balance or coordination -painful or prolonged erections -restlessness, pacing, inability to keep still -suicidal thoughts or other mood changes -tremors -trouble  sleeping -seizures -unusual bleeding or bruising Side effects that usually do not require medical attention (report to your doctor or health care professional if they continue or are bothersome): -change in sex drive or performance -change in appetite or weight -constipation -headache -muscle aches or pains -nausea This list may not describe all possible side effects. Call your doctor for medical advice about side effects. You may report side effects to FDA at 1-800-FDA-1088. Where should I keep my medicine? Keep out of the reach of children. Store at room temperature between 15 and 30 degrees C (59 to 86 degrees F). Protect from light. Keep container tightly closed. Throw away any unused medicine after the expiration date. NOTE: This sheet is a summary. It may not cover all possible information. If you have questions about this medicine, talk to your doctor, pharmacist, or health care provider.  2018 Elsevier/Gold Standard (2016-05-03 16:57:05)

## 2018-04-25 ENCOUNTER — Encounter: Payer: Self-pay | Admitting: Internal Medicine

## 2018-04-25 ENCOUNTER — Other Ambulatory Visit: Payer: Self-pay | Admitting: Adult Health

## 2018-04-25 DIAGNOSIS — E785 Hyperlipidemia, unspecified: Secondary | ICD-10-CM

## 2018-04-25 LAB — CBC WITH DIFFERENTIAL/PLATELET
BASOS ABS: 133 {cells}/uL (ref 0–200)
Basophils Relative: 1.2 %
EOS PCT: 4.1 %
Eosinophils Absolute: 455 cells/uL (ref 15–500)
HEMATOCRIT: 42.7 % (ref 35.0–45.0)
Hemoglobin: 14.3 g/dL (ref 11.7–15.5)
LYMPHS ABS: 2209 {cells}/uL (ref 850–3900)
MCH: 29.1 pg (ref 27.0–33.0)
MCHC: 33.5 g/dL (ref 32.0–36.0)
MCV: 87 fL (ref 80.0–100.0)
MPV: 10.7 fL (ref 7.5–12.5)
Monocytes Relative: 6.6 %
NEUTROS PCT: 68.2 %
Neutro Abs: 7570 cells/uL (ref 1500–7800)
Platelets: 293 10*3/uL (ref 140–400)
RBC: 4.91 10*6/uL (ref 3.80–5.10)
RDW: 13.7 % (ref 11.0–15.0)
Total Lymphocyte: 19.9 %
WBC mixed population: 733 cells/uL (ref 200–950)
WBC: 11.1 10*3/uL — ABNORMAL HIGH (ref 3.8–10.8)

## 2018-04-25 LAB — COMPLETE METABOLIC PANEL WITH GFR
AG RATIO: 1.6 (calc) (ref 1.0–2.5)
ALKALINE PHOSPHATASE (APISO): 71 U/L (ref 33–115)
ALT: 15 U/L (ref 6–29)
AST: 18 U/L (ref 10–30)
Albumin: 3.9 g/dL (ref 3.6–5.1)
BUN: 15 mg/dL (ref 7–25)
CO2: 26 mmol/L (ref 20–32)
Calcium: 9.2 mg/dL (ref 8.6–10.2)
Chloride: 107 mmol/L (ref 98–110)
Creat: 0.72 mg/dL (ref 0.50–1.10)
GFR, Est African American: 120 mL/min/{1.73_m2} (ref >=60)
GFR, Est Non African American: 103 mL/min/{1.73_m2} (ref >=60)
GLOBULIN: 2.5 g/dL (ref 1.9–3.7)
Glucose, Bld: 89 mg/dL (ref 65–99)
POTASSIUM: 4.2 mmol/L (ref 3.5–5.3)
SODIUM: 138 mmol/L (ref 135–146)
Total Bilirubin: 0.5 mg/dL (ref 0.2–1.2)
Total Protein: 6.4 g/dL (ref 6.1–8.1)

## 2018-04-25 LAB — LIPID PANEL
CHOLESTEROL: 220 mg/dL — AB (ref <200)
HDL: 42 mg/dL — AB (ref >50)
LDL Cholesterol (Calc): 158 mg/dL (calc) — ABNORMAL HIGH
Non-HDL Cholesterol (Calc): 178 mg/dL (calc) — ABNORMAL HIGH (ref <130)
Total CHOL/HDL Ratio: 5.2 (calc) — ABNORMAL HIGH (ref <5.0)
Triglycerides: 94 mg/dL (ref <150)

## 2018-04-25 LAB — TSH: TSH: 15.88 mIU/L — ABNORMAL HIGH

## 2018-04-25 LAB — HEMOGLOBIN A1C
EAG (MMOL/L): 6.3 (calc)
Hgb A1c MFr Bld: 5.6 % of total Hgb (ref <5.7)
MEAN PLASMA GLUCOSE: 114 (calc)

## 2018-04-25 LAB — VITAMIN D 25 HYDROXY (VIT D DEFICIENCY, FRACTURES): Vit D, 25-Hydroxy: 27 ng/mL — ABNORMAL LOW (ref 30–100)

## 2018-04-25 MED ORDER — ATORVASTATIN CALCIUM 10 MG PO TABS: 10.0000 mg | ORAL_TABLET | Freq: Every day | ORAL | 1 refills | Status: DC

## 2018-05-05 ENCOUNTER — Other Ambulatory Visit: Payer: Self-pay

## 2018-05-05 MED ORDER — PHENTERMINE HCL 37.5 MG PO CAPS: ORAL_CAPSULE | ORAL | 3 refills | Status: DC

## 2018-05-05 NOTE — Telephone Encounter (Signed)
Refill request for Phentermine 37.5mg  tablet

## 2018-05-07 ENCOUNTER — Encounter: Payer: 59 | Admitting: Internal Medicine

## 2018-05-07 ENCOUNTER — Telehealth: Payer: Self-pay | Admitting: Internal Medicine

## 2018-05-28 ENCOUNTER — Other Ambulatory Visit: Payer: Self-pay | Admitting: Internal Medicine

## 2018-06-05 DIAGNOSIS — I10 Essential (primary) hypertension: Secondary | ICD-10-CM | POA: Diagnosis not present

## 2018-06-05 DIAGNOSIS — Z136 Encounter for screening for cardiovascular disorders: Secondary | ICD-10-CM | POA: Diagnosis not present

## 2018-06-05 DIAGNOSIS — E0500 Thyrotoxicosis with diffuse goiter without thyrotoxic crisis or storm: Secondary | ICD-10-CM | POA: Diagnosis not present

## 2018-06-24 ENCOUNTER — Other Ambulatory Visit: Payer: Self-pay

## 2018-06-24 ENCOUNTER — Ambulatory Visit (INDEPENDENT_AMBULATORY_CARE_PROVIDER_SITE_OTHER): Payer: 59 | Admitting: Internal Medicine

## 2018-06-24 ENCOUNTER — Encounter: Payer: Self-pay | Admitting: Internal Medicine

## 2018-06-24 VITALS — BP 118/80 | HR 94 | Ht 67.75 in | Wt 216.4 lb

## 2018-06-24 DIAGNOSIS — E079 Disorder of thyroid, unspecified: Secondary | ICD-10-CM

## 2018-06-24 DIAGNOSIS — E0500 Thyrotoxicosis with diffuse goiter without thyrotoxic crisis or storm: Secondary | ICD-10-CM | POA: Diagnosis not present

## 2018-06-24 DIAGNOSIS — L270 Generalized skin eruption due to drugs and medicaments taken internally: Secondary | ICD-10-CM | POA: Diagnosis not present

## 2018-06-24 DIAGNOSIS — Z8639 Personal history of other endocrine, nutritional and metabolic disease: Secondary | ICD-10-CM | POA: Diagnosis not present

## 2018-06-24 DIAGNOSIS — E039 Hypothyroidism, unspecified: Secondary | ICD-10-CM

## 2018-06-24 LAB — T4, FREE: FREE T4: 1.08 ng/dL (ref 0.60–1.60)

## 2018-06-24 LAB — TSH: TSH: 0.11 u[IU]/mL — AB (ref 0.35–4.50)

## 2018-06-24 NOTE — Patient Instructions (Addendum)
Please continue Tirosint 200 mcg daily.  Take the thyroid hormone every day, with water, at least 30 minutes before breakfast, separated by at least 4 hours from: - acid reflux medications - calcium - iron - multivitamins  Please stop at the lab.  Stop Chantix.  Please come back for a follow-up appointment in 6 months.

## 2018-06-24 NOTE — Progress Notes (Signed)
Patient ID: Tammy Gibson, female   DOB: 11-Dec-1976, 42 y.o.   MRN: [id number]    HPI  Tammy Gibson is a 42 y.o.-year-old female, initially referred by her PCP, Liane Comber, returning for follow-up for uncontrolled hypothyroidism.  Last visit 4 months ago.  She has been diagnosed with hyperthyroidism (likely Graves' disease) 2000, during investigation for weight loss (from 190 pounds to 114 pounds.  She was treated with methimazole then and she developed hypothyroidism few years later.  At last visit, she was on generic levothyroxine 200 mcg daily, with the last dose change in 2017.  At that time, she was taking the medication correctly, but her TFTs were still abnormal and she did not feel good.  I suggested to switch to Tirosint and she is currently taking 200 mcg daily of the liquid levothyroxine capsules.  She needed to have a PA for obtaining the medication.  On Tirosint, her TSH decreased to approximately half, but was still above target as per last check in 04/2018.  She feels much better on Tirosint.  She is taking the Tirosint 200 mcg daily: - in am - fasting - Has coffee and cream 1.5 hours later - at least 30 min from b'fast, but usually skips breakfast - no Ca, Fe, MVI -+ Protonix at night - not on Biotin  I reviewed pt's thyroid tests -TSH still high: Lab Results  Component Value Date   TSH 15.88 (H) 04/24/2018   TSH 28.04 (H) 02/18/2018   TSH 14.13 (H) 12/24/2017   TSH 20.40 (H) 10/24/2017   TSH 78.91 (H) 03/26/2017   TSH 93.84 (H) 09/17/2016   TSH 108.17 (H) 02/27/2016   TSH 88.737 (H) 08/23/2015   TSH 23.561 (H) 06/13/2015   TSH 105.118 (H) 05/10/2015   TSH 0.673 11/18/2014   TSH 12.496 (H) 08/17/2014   TSH 0.071 (L) 12/29/2013   TSH 0.285 (L) 11/18/2013   TSH 0.063 (L) 10/28/2013   FREET4 0.43 (L) 02/18/2018   T3FREE 2.6 02/18/2018   At last visit, TSI antibodies were positive: Lab Results  Component Value Date   TSI 348 (H) 02/18/2018    Pt  mentions: - + Fatigue -+ Heat intolerance, hot flashes - no depression or anxiety - + Dry skin, no hair loss - Improved constipation -has history of IBS - She has a history of weight gain, but at last visit she had some intentional weight loss after she started phentermine, and stopped eating fried foods and bread  Pt denies: - feeling nodules in neck - hoarseness - dysphagia - choking - SOB with lying down  She has + FH of thyroid disorders in: PGM, P aunts, cousin, sisters, [name] , [place]. No FH of thyroid cancer. No h/o radiation tx to head or neck.  She has Thyroid Eye Ds: -She has right eye exophthalmos -Has double vision -Has prism glasses -This is progressive  She recently saw ophthalmology and she is preparing for orbital decompression surgery.  However, until then, she needs to quit smoking.  She currently smokes half a pack a day.  She was given Chantix by the ophthalmologist but she does not feel that this is helping too much.  Also, she describes a rash on her chest, back that is very itchy.  Also, she describes that ever since she started Chantix she has a papula on the inside of her left upper lip.  She also has a history of IBS with constipation.  ROS: Constitutional: + See HPI  Eyes: +  Blurry vision, no xerophthalmia ENT:  +sore throat,  + see HPI Cardiovascular: no CP/no SOB/no palpitations/+ leg swelling Respiratory: no cough/no SOB/no wheezing Gastrointestinal: no N/no V/no D/no C/no acid reflux Musculoskeletal: + Muscle aches/no joint aches Skin: + Erythematous rash on cleavage and upper back, also papula on the inside of left upper lip,  no hair loss Neurological: no tremors/no numbness/no tingling/no dizziness, + headaches  I reviewed pt's medications, allergies, PMH, social hx, family hx, and changes were documented in the history of present illness. Otherwise, unchanged from my initial visit note.  Past Medical History:  Diagnosis Date  . Abdominal pain   .  Adenomyosis   . Bell's palsy   . Dysmenorrhea   . Dyspareunia   . Elevated LFTs   . Endometriosis   . Hyperlipidemia   . Infertility, female   . Thyroid disease    pt was hypo now hyperthyroid  . Urinary incontinence   . Vitamin D deficiency    Past Surgical History:  Procedure Laterality Date  . ECTOPIC PREGNANCY SURGERY  1997   RIGHT TUBE  . IUD REMOVAL  2013   mirena,only in for 6 months  . SALPINGECTOMY Right    Social History   Socioeconomic History  . Marital status: Married    Spouse name: Not on file  . Number of children: No  Social Needs  Occupational History  . Operations lead  Tobacco Use  . Smoking status: Current Every Day Smoker    Packs/day: 0.25    Years: 30.00    Pack years: 7.50    Types: Cigarettes  . Smokeless tobacco: Never Used  . Tobacco comment: smokes about 2-3 cigarettes a day  Substance and Sexual Activity  . Alcohol use: No    Alcohol/week: 0.0 oz    Comment: Patient stopped alcohol due to elevated liver enzymes.  . Drug use: No  . Sexual activity: Yes    Partners: Male   Current Outpatient Medications on File Prior to Visit  Medication Sig Dispense Refill  . Acetaminophen (TYLENOL) 325 MG CAPS Take by mouth.    Marland Kitchen atorvastatin (LIPITOR) 10 MG tablet Take 1 tablet (10 mg total) by mouth daily. 90 tablet 1  . Cholecalciferol (VITAMIN D3) 10000 UNITS capsule Take 10,000 Units by mouth daily.    Marland Kitchen dicyclomine (BENTYL) 20 MG tablet Take 1 tablet (20 mg total) by mouth 4 (four) times daily -  before meals and at bedtime. 90 tablet 1  . glycopyrrolate (ROBINUL) 1 MG tablet TAKE 1 TABLET (1 MG TOTAL) BY MOUTH 2 (TWO) TIMES DAILY AS NEEDED. 60 tablet 0  . OVER THE COUNTER MEDICATION Hemorrhoid cream daily    . pantoprazole (PROTONIX) 40 MG tablet TAKE 1 TABLET (40 MG TOTAL) BY MOUTH 2 (TWO) TIMES DAILY BEFORE A MEAL. 60 tablet 0  . PEG-KCl-NaCl-NaSulf-Na Asc-C (PLENVU) 140 g SOLR Take 1 kit by mouth as directed. 1 each 0  . phentermine 37.5  MG capsule Take 1/2 to 1 tablet by mouth daily for dieting and weightloss. 30 capsule 3  . TIROSINT 200 MCG CAPS TAKE 200 MCG BY MOUTH DAILY. 90 capsule 2  . traZODone (DESYREL) 50 MG tablet 1/2-1 tablet for sleep 90 tablet 1  . triamcinolone ointment (KENALOG) 0.1 % Apply 1 application 2 (two) times daily topically. 80 g 1   No current facility-administered medications on file prior to visit.    No Known Allergies Family History  Problem Relation Age of Onset  . Hypertension Father   .  Hyperlipidemia Father   . Diabetes Father   . Thyroid disease Father   . Thyroid disease Mother   . Breast cancer Maternal Aunt   . Thyroid disease Sister   . Cancer - Colon Paternal Grandfather   . Thyroid disease Paternal Grandmother        Hashimoto's    PE: BP 118/80   Pulse 94   Ht 5' 7.75" (1.721 m)   Wt 216 lb 6.4 oz (98.2 kg)   SpO2 98%   BMI 33.15 kg/m  Wt Readings from Last 3 Encounters:  06/24/18 216 lb 6.4 oz (98.2 kg)  04/24/18 221 lb 12.8 oz (100.6 kg)  02/24/18 214 lb (97.1 kg)   Constitutional: overweight, in NAD Eyes: PERRLA, EOMI, + right exophthalmos, without lid lag, and with mild stare ENT: moist mucous membranes, + symmetric mild thyromegaly, no cervical lymphadenopathy Cardiovascular: Tachycardia, RR, No MRG Respiratory: CTA B Gastrointestinal: abdomen soft, NT, ND, BS+ Musculoskeletal: no deformities, strength intact in all 4 Skin: moist, warm, no rashes Neurological: no tremor with outstretched hands, DTR normal in all 4  ASSESSMENT: 1. Hypothyroidism - After resolution of hyperthyroidism (most likely Graves' disease).  2.Thyroid eye disease  3. H/o Graves ds.  4.  Rash  PLAN:  1. Patient with long-standing, uncontrolled, hypothyroidism, on levothyroxine therapy.  She developed hypothyroidism while on methimazole for treating hyperthyroidism.  She was started on levothyroxine, but her tests were never controlled on this medication.  At last visit, she  was on 200 mcg daily, which she was taking consistently.  At that time, she had weight gain, fatigue, very dry skin.  I advised her to switch to Tirosint (liquid levothyroxine) 200 mcg daily.  We had to do a PA for the medication, however, this was approved. - latest thyroid labs reviewed with pt >> improved, but not quite to normal.  We did not change the levothyroxine dose at last check in 04/2018. - pt feels better on Tirosint. - we discussed about taking the thyroid hormone every day, with water, >30 minutes before breakfast, separated by >4 hours from acid reflux medications, calcium, iron, multivitamins. Pt. is taking it correctly. - will check thyroid tests today: TSH and fT4.  We may need to change her Tirosint dose if TFTs are still abnormal - If labs are abnormal, she will need to return for repeat TFTs in 1.5 months  2.Thyroid eye disease -Seeing ophthalmology at [facility] reviewed Dr. Earlie Server notes from 12/2017: Mild proptosis, normal visual fields and normal OCT of the retinal nerve.  -She will have ocular decompression sx >> needs to quit smoking by then (0.5 pack a day) -For now, she uses prism glasses  3. H/o Graves ds. -A TSI level obtained at last visit was elevated, meaning that her Graves' disease is still active, despite hypothyroidism -We will repeat a TSI level per request from ophthalmologist. - If TSIs are high >> we discussed to start selenium 200 mcg daily  4.  Rash -She has skin and macular rash after she started Chantix.  I advised him to stop the medication.  Office Visit on 06/24/2018  Component Date Value Ref Range Status  . TSH 06/24/2018 0.11* 0.35 - 4.50 uIU/mL Final  . Free T4 06/24/2018 1.08  0.60 - 1.60 ng/dL Final   Comment: Specimens from patients who are undergoing biotin therapy and /or ingesting biotin supplements may contain high levels of biotin.  The higher biotin concentration in these specimens interferes with this Free T4 assay.  Specimens  that contain high levels  of biotin may cause false high results for this Free T4 assay.  Please interpret results in light of the total clinical presentation of the patient.    Marland Kitchen TSI 06/24/2018 354* <140 % baseline Final   TSH is suppressed, so we will need to decrease the dose of Tirosint and check her labs again in 1.5 months.  Her TSI antibodies are still high.  I will advise her to let her ophthalmologist know about this.  Philemon Kingdom, MD PhD [facility] Endocrinology

## 2018-06-27 ENCOUNTER — Encounter: Payer: Self-pay | Admitting: Internal Medicine

## 2018-06-27 ENCOUNTER — Other Ambulatory Visit: Payer: Self-pay | Admitting: Internal Medicine

## 2018-06-27 LAB — THYROID STIMULATING IMMUNOGLOBULIN: TSI: 354 %{baseline} — AB (ref <140)

## 2018-06-27 MED ORDER — TIROSINT 175 MCG PO CAPS: 175.0000 ug | ORAL_CAPSULE | Freq: Every day | ORAL | 3 refills | Status: DC

## 2018-08-04 DIAGNOSIS — L570 Actinic keratosis: Secondary | ICD-10-CM | POA: Diagnosis not present

## 2018-08-04 DIAGNOSIS — L821 Other seborrheic keratosis: Secondary | ICD-10-CM | POA: Diagnosis not present

## 2018-08-04 DIAGNOSIS — D485 Neoplasm of uncertain behavior of skin: Secondary | ICD-10-CM | POA: Diagnosis not present

## 2018-08-04 DIAGNOSIS — B360 Pityriasis versicolor: Secondary | ICD-10-CM | POA: Diagnosis not present

## 2018-08-07 DIAGNOSIS — D225 Melanocytic nevi of trunk: Secondary | ICD-10-CM | POA: Diagnosis not present

## 2018-09-08 DIAGNOSIS — D485 Neoplasm of uncertain behavior of skin: Secondary | ICD-10-CM | POA: Diagnosis not present

## 2018-10-17 DIAGNOSIS — D485 Neoplasm of uncertain behavior of skin: Secondary | ICD-10-CM | POA: Diagnosis not present

## 2018-10-22 DIAGNOSIS — J069 Acute upper respiratory infection, unspecified: Secondary | ICD-10-CM | POA: Diagnosis not present

## 2018-10-24 DIAGNOSIS — K219 Gastro-esophageal reflux disease without esophagitis: Secondary | ICD-10-CM | POA: Insufficient documentation

## 2018-10-24 NOTE — Progress Notes (Deleted)
Complete Physical  Assessment and Plan:  Tammy Gibson was seen today for annual exam.  Diagnoses and all orders for this visit:  Encounter for general adult medical examination with abnormal findings  Essential hypertension Continue medications Monitor blood pressure at home; call if consistently over 130/80 Continue DASH diet.   Reminder to go to the ER if any CP, SOB, nausea, dizziness, severe HA, changes vision/speech, left arm numbness and tingling and jaw pain. -     Magnesium  Hypothyroidism due to Hashimoto's thyroiditis Managed by Dr. Cruzita Lederer, improved response with tirosint Reminded to take medication on empty stomach with water only -     TSH  Thyroid eye disease/esophthalmos Continue thyroid management by Dr. Cruzita Lederer Pending decompression surgery by ophthalmology  Tobacco use *** Discussed risks associated with tobacco use and advised to reduce or quit Patient is not ready to do so, but advised to consider strongly Will follow up at the next visit  GERD Well managed on current medications Discussed diet, avoiding triggers and other lifestyle changes  Mixed hyperlipidemia Patient has stopped statin due to muscle aches; *** would like to strongly pursue lifestyle changes Discussed starting fiber supplement in interim  -     TSH -     Lipid panel  Vitamin D deficiency -     VITAMIN D 25 Hydroxy (Vit-D Deficiency, Fractures)  Endometriosis       -     Follow up with GYN  History of elevated glucose Discussed diet/weight loss -     Hemoglobin A1c  Medication management -     CBC with Differential/Platelet -     CMP/GFR -     Urinalysis, Complete (81001)  Obesity Long discussion about weight loss, diet, and exercise; commended patient on 10 lb weight loss and many good changes.  Discussed final goal weight and current weight loss goal (200 lb) Patient will work on reducing coffee (sugar/creamer) consumption and continue good changes Patient on phentermine  with benefit and no SE, taking drug breaks; continue close follow up. Return in 6 months  -     phentermine (ADIPEX-P) 37.5 MG tablet; Take 1/2 to 1 tablet every morning for dieting & weightloss ***  Screening for cardiovascular condition -     EKG 12-Lead  Screening for deficiency anemia -     CBC with Differential/Platelet -     Vitamin B12  Screening for hematuria or proteinuria -     Urinalysis, Complete (81001)  CKD (chronic kidney disease), stage II Discussed tapering down of coffee, increasing water to 80-100 fluid ounces, avoid NSAIDs -     CMP WITH GFR   Discussed med's effects and SE's. Screening labs and tests as requested with regular follow-up as recommended. Over 40 minutes of exam, counseling, chart review, and complex, high level critical decision making was performed this visit.   Future Appointments  Date Time Provider [place]  10/27/2018  9:00 AM Liane Comber, NP [facility] None  12/25/2018 10:00 AM Philemon Kingdom, MD [facility] None  10/28/2019  9:00 AM Liane Comber, NP [facility] None    HPI  42 y.o. female  presents for a complete physical and follow up for has Acquired hypothyroidism; Hyperlipidemia; Vitamin D deficiency; Endometriosis; Essential hypertension; Medication management; History of elevated glucose; Obesity (BMI 30.0-34.9); CKD (chronic kidney disease), stage II; Thyroid eye disease; H/O Graves' disease; Insomnia; and Acid reflux on their problem list.   *** She reports a recurrent rash on her chest that begins 3 days prior  to menstrual cycle and clears spontaneously. She reports this does itch, but without purulent discharge. She does not currently have the rash but presents a photo which shows 2-3 small injected areas on her chest. she is down to smoking 2-3 cigarettes a day; discussed risks associated with smoking, patient is actively trying to quit. She also describes a tender small palpable lump in her right breast/axilla  during menstruation which resolves and is not palpable otherwise; not present on exam today. She is currently drinking a large amount of coffee (20 oz mug refilled "maybe 10-12 times a day" with cream and sugar); discussed risks and kidney function - patient will taper down slowly on this.     She is on thyroid medication for hypothyroid consequent of hyperthyroid (presumed Grave's) which was treated by oral medication in 2000; consequent levels have been difficult to manage and was referred for management to endocrine Dr. Cruzita Lederer, now on tirosint. Early in 2019 she presented with exophthalmos with progressive vision changes and is managed by specialist, discussion surgical intervention ***.  Her medication was changed last visit, now taking 175 mcg ***:  Lab Results  Component Value Date   TSH 0.11 (L) 06/24/2018  .    BMI is There is no height or weight on file to calculate BMI., she has been working on diet and exercise. She walks 16000-18000 steps daily. She has been working on diet as well - salads and grilled chicken, seafood, avoiding fried foods, she is working on cutting down on flour- has switched to whole grain/multigrain.  Wt Readings from Last 3 Encounters:  06/24/18 216 lb 6.4 oz (98.2 kg)  04/24/18 221 lb 12.8 oz (100.6 kg)  02/24/18 214 lb (97.1 kg)   she is prescribed phentermine for weight loss.  While on the medication they have lost *** lbs since last visit. They deny palpitations, anxiety, trouble sleeping, elevated BP. She has been off for 35 days and requesting a refill.   She does not take her BP at home, today their BP is   She does not workout but walks 16000+ steps daily at work. She denies chest pain, shortness of breath, dizziness.   She has stopped taking cholesterol medication *** and denies myalgias, she has been working on diet and exercise. Her cholesterol is not at goal. The cholesterol last visit was:   Lab Results  Component Value Date   CHOL 220 (H)  04/24/2018   HDL 42 (L) 04/24/2018   LDLCALC 158 (H) 04/24/2018   TRIG 94 04/24/2018   CHOLHDL 5.2 (H) 04/24/2018    Last A1C in the office was:  Lab Results  Component Value Date   HGBA1C 5.6 04/24/2018   Last GFR: Lab Results  Component Value Date   GFRNONAA 103 04/24/2018   Patient has started on Vitamin D supplement since last visit:  Lab Results  Component Value Date   VD25OH 27 (L) 04/24/2018       Current Medications:  Current Outpatient Medications on File Prior to Visit  Medication Sig Dispense Refill  . Acetaminophen (TYLENOL) 325 MG CAPS Take by mouth.    Marland Kitchen atorvastatin (LIPITOR) 10 MG tablet Take 1 tablet (10 mg total) by mouth daily. 90 tablet 1  . CHANTIX STARTING MONTH PAK 0.5 MG X 11 & 1 MG X 42 tablet See admin instructions. follow package directions  0  . Cholecalciferol (VITAMIN D3) 10000 UNITS capsule Take 10,000 Units by mouth daily.    Marland Kitchen dicyclomine (BENTYL) 20 MG  tablet Take 1 tablet (20 mg total) by mouth 4 (four) times daily -  before meals and at bedtime. 90 tablet 1  . glycopyrrolate (ROBINUL) 1 MG tablet TAKE 1 TABLET (1 MG TOTAL) BY MOUTH 2 (TWO) TIMES DAILY AS NEEDED. 60 tablet 0  . OVER THE COUNTER MEDICATION Hemorrhoid cream daily    . pantoprazole (PROTONIX) 40 MG tablet TAKE 1 TABLET (40 MG TOTAL) BY MOUTH 2 (TWO) TIMES DAILY BEFORE A MEAL. 60 tablet 0  . PEG-KCl-NaCl-NaSulf-Na Asc-C (PLENVU) 140 g SOLR Take 1 kit by mouth as directed. 1 each 0  . phentermine 37.5 MG capsule Take 1/2 to 1 tablet by mouth daily for dieting and weightloss. 30 capsule 3  . TIROSINT 175 MCG CAPS Take 175 mcg by mouth daily. 45 capsule 3  . traZODone (DESYREL) 50 MG tablet 1/2-1 tablet for sleep 90 tablet 1  . triamcinolone ointment (KENALOG) 0.1 % Apply 1 application 2 (two) times daily topically. 80 g 1   No current facility-administered medications on file prior to visit.    Allergies:  Allergies  Allergen Reactions  . Chantix [Varenicline Tartrate]     Medical History:  She has Acquired hypothyroidism; Hyperlipidemia; Vitamin D deficiency; Endometriosis; Essential hypertension; Medication management; History of elevated glucose; Obesity (BMI 30.0-34.9); CKD (chronic kidney disease), stage II; Thyroid eye disease; H/O Graves' disease; Insomnia; and Acid reflux on their problem list. Health Maintenance:   Immunization History  Administered Date(s) Administered  . Hepatitis B 01/11/2011  . Influenza-Unspecified 10/11/2013, 09/24/2017  . PPD Test 08/17/2014, 08/23/2015, 09/17/2016  . Td 12/17/2008   Tetanus: 2010 Pneumovax: n/a Prevnar 13: n/a Flu vaccine: 2018 Shingrix: n/a LMP: No LMP recorded. Pap: 2015 - DUE - she will follow up with GYN for endometriosis follow up as well MGM: *** DEXA: n/a Colonoscopy: n/a EGD: n/a  Last Dental Exam: 03/2017 - scheduled for January 2019 Last Eye Exam: 07/2017 Last Derm Exam: 2015 - will schedule follow up this year  Patient Care Team: Unk Pinto, MD as PCP - General (Internal Medicine)  Surgical History:  She has a past surgical history that includes Ectopic pregnancy surgery (1997); Salpingectomy (Right); and IUD removal (2013). Family History:  Herfamily history includes Breast cancer in her maternal aunt; Cancer - Colon in her paternal grandfather; Diabetes in her father; Hyperlipidemia in her father; Hypertension in her father; Thyroid disease in her father, mother, paternal grandmother, and sister. Social History:  She reports that she has been smoking cigarettes. She has a 7.50 pack-year smoking history. She has never used smokeless tobacco. She reports that she does not drink alcohol or use drugs.  Review of Systems: Review of Systems  Constitutional: Negative for malaise/fatigue and weight loss.  HENT: Negative for hearing loss and tinnitus.   Eyes: Negative for blurred vision and double vision.  Respiratory: Negative for cough, shortness of breath and wheezing.    Cardiovascular: Negative for chest pain, palpitations, orthopnea, claudication and leg swelling.  Gastrointestinal: Negative for abdominal pain, blood in stool, constipation, diarrhea, heartburn, melena, nausea and vomiting.  Genitourinary: Negative.   Musculoskeletal: Negative for joint pain and myalgias.  Skin: Negative for rash.  Neurological: Negative for dizziness, tingling, sensory change, weakness and headaches.  Endo/Heme/Allergies: Negative for polydipsia.  Psychiatric/Behavioral: Negative.   All other systems reviewed and are negative.   Physical Exam: Estimated body mass index is 33.15 kg/m as calculated from the following:   Height as of 06/24/18: 5' 7.75" (1.721 m).   Weight as of  06/24/18: 216 lb 6.4 oz (98.2 kg). There were no vitals taken for this visit. General Appearance: Well nourished, in no apparent distress.  Eyes: PERRLA, EOMs, conjunctiva no swelling or erythema. *** exophthalmos Sinuses: No Frontal/maxillary tenderness  ENT/Mouth: Ext aud canals clear, normal light reflex with TMs without erythema, bulging. Good dentition. No erythema, swelling, or exudate on post pharynx. Tonsils not swollen or erythematous. Hearing normal.  Neck: Supple, thyroid normal. No bruits  Respiratory: Respiratory effort normal, BS equal bilaterally without rales, rhonchi, wheezing or stridor.  Cardio: RRR without murmurs, rubs or gallops. Brisk peripheral pulses without edema.  Chest: symmetric, with normal excursions and percussion.  Breasts: Symmetric, without lumps, nipple discharge, retractions.  Abdomen: Soft, nontender, no guarding, rebound, hernias, masses, or organomegaly.  Lymphatics: Non tender without lymphadenopathy.  Genitourinary: Defer to GYN Musculoskeletal: Full ROM all peripheral extremities,5/5 strength, and normal gait.  Skin: Warm, dry without rashes, lesions, ecchymosis. Nails on hands somewhat flattened/brittle.  Neuro: Cranial nerves intact, reflexes equal  bilaterally. Normal muscle tone, no cerebellar symptoms. Sensation intact.  Psych: Awake and oriented X 3, normal affect, Insight and Judgment appropriate.   EKG: WNL no ST changes.  Tammy Gibson 10:26 AM [facility]

## 2018-10-27 ENCOUNTER — Encounter: Payer: Self-pay | Admitting: Adult Health

## 2018-10-27 DIAGNOSIS — Z0000 Encounter for general adult medical examination without abnormal findings: Secondary | ICD-10-CM

## 2018-12-25 ENCOUNTER — Ambulatory Visit: Payer: 59 | Admitting: Internal Medicine

## 2018-12-29 ENCOUNTER — Other Ambulatory Visit: Payer: 59

## 2019-01-05 ENCOUNTER — Ambulatory Visit: Payer: 59 | Admitting: Internal Medicine

## 2019-01-05 ENCOUNTER — Telehealth: Payer: Self-pay | Admitting: Internal Medicine

## 2019-01-05 DIAGNOSIS — Z0289 Encounter for other administrative examinations: Secondary | ICD-10-CM

## 2019-01-05 NOTE — Telephone Encounter (Signed)
Patient no showed today's appt. Please advise on how to follow up. °A. No follow up necessary. °B. Follow up urgent. Contact patient immediately. °C. Follow up necessary. Contact patient and schedule visit in ___ days. °D. Follow up advised. Contact patient and schedule visit in ____weeks. ° °Would you like the NS fee to be applied to this visit? ° °

## 2019-01-05 NOTE — Telephone Encounter (Signed)
6 mo 

## 2019-03-23 ENCOUNTER — Encounter: Payer: Self-pay | Admitting: Internal Medicine

## 2019-07-01 ENCOUNTER — Other Ambulatory Visit: Payer: Self-pay | Admitting: Internal Medicine

## 2019-10-28 ENCOUNTER — Encounter: Payer: Self-pay | Admitting: Adult Health

## 2021-05-05 DIAGNOSIS — G5603 Carpal tunnel syndrome, bilateral upper limbs: Secondary | ICD-10-CM | POA: Insufficient documentation

## 2021-08-03 DIAGNOSIS — M65341 Trigger finger, right ring finger: Secondary | ICD-10-CM | POA: Insufficient documentation

## 2021-08-14 DIAGNOSIS — R490 Dysphonia: Secondary | ICD-10-CM | POA: Insufficient documentation

## 2022-02-20 DIAGNOSIS — Z87891 Personal history of nicotine dependence: Secondary | ICD-10-CM | POA: Diagnosis not present

## 2022-02-20 DIAGNOSIS — R109 Unspecified abdominal pain: Secondary | ICD-10-CM | POA: Diagnosis not present

## 2022-02-20 DIAGNOSIS — R9431 Abnormal electrocardiogram [ECG] [EKG]: Secondary | ICD-10-CM | POA: Diagnosis not present

## 2022-02-20 DIAGNOSIS — K810 Acute cholecystitis: Secondary | ICD-10-CM | POA: Diagnosis not present

## 2022-02-20 DIAGNOSIS — R7401 Elevation of levels of liver transaminase levels: Secondary | ICD-10-CM | POA: Diagnosis not present

## 2022-02-20 DIAGNOSIS — K828 Other specified diseases of gallbladder: Secondary | ICD-10-CM | POA: Diagnosis not present

## 2022-02-20 DIAGNOSIS — R110 Nausea: Secondary | ICD-10-CM | POA: Diagnosis not present

## 2022-02-20 DIAGNOSIS — K8020 Calculus of gallbladder without cholecystitis without obstruction: Secondary | ICD-10-CM | POA: Diagnosis not present

## 2022-02-20 DIAGNOSIS — R932 Abnormal findings on diagnostic imaging of liver and biliary tract: Secondary | ICD-10-CM | POA: Diagnosis not present

## 2022-02-20 DIAGNOSIS — R7989 Other specified abnormal findings of blood chemistry: Secondary | ICD-10-CM | POA: Diagnosis not present

## 2022-02-20 DIAGNOSIS — R1011 Right upper quadrant pain: Secondary | ICD-10-CM | POA: Diagnosis not present

## 2022-02-20 DIAGNOSIS — K8012 Calculus of gallbladder with acute and chronic cholecystitis without obstruction: Secondary | ICD-10-CM | POA: Diagnosis not present

## 2022-02-20 DIAGNOSIS — Z20822 Contact with and (suspected) exposure to covid-19: Secondary | ICD-10-CM | POA: Diagnosis not present

## 2022-02-21 DIAGNOSIS — R9431 Abnormal electrocardiogram [ECG] [EKG]: Secondary | ICD-10-CM | POA: Diagnosis not present

## 2022-02-21 DIAGNOSIS — Z20822 Contact with and (suspected) exposure to covid-19: Secondary | ICD-10-CM | POA: Diagnosis not present

## 2022-02-21 DIAGNOSIS — K810 Acute cholecystitis: Secondary | ICD-10-CM | POA: Diagnosis not present

## 2022-02-21 DIAGNOSIS — E669 Obesity, unspecified: Secondary | ICD-10-CM | POA: Diagnosis not present

## 2022-02-21 DIAGNOSIS — K8000 Calculus of gallbladder with acute cholecystitis without obstruction: Secondary | ICD-10-CM | POA: Diagnosis not present

## 2022-02-21 DIAGNOSIS — Z6834 Body mass index (BMI) 34.0-34.9, adult: Secondary | ICD-10-CM | POA: Diagnosis not present

## 2022-02-21 DIAGNOSIS — R109 Unspecified abdominal pain: Secondary | ICD-10-CM | POA: Diagnosis not present

## 2022-02-21 DIAGNOSIS — K8020 Calculus of gallbladder without cholecystitis without obstruction: Secondary | ICD-10-CM | POA: Diagnosis not present

## 2022-02-21 DIAGNOSIS — K8010 Calculus of gallbladder with chronic cholecystitis without obstruction: Secondary | ICD-10-CM | POA: Diagnosis not present

## 2022-02-21 DIAGNOSIS — R7989 Other specified abnormal findings of blood chemistry: Secondary | ICD-10-CM | POA: Diagnosis not present

## 2022-02-21 DIAGNOSIS — Z87891 Personal history of nicotine dependence: Secondary | ICD-10-CM | POA: Diagnosis not present

## 2022-02-21 DIAGNOSIS — K828 Other specified diseases of gallbladder: Secondary | ICD-10-CM | POA: Diagnosis not present

## 2022-02-21 DIAGNOSIS — R7401 Elevation of levels of liver transaminase levels: Secondary | ICD-10-CM | POA: Diagnosis not present

## 2022-02-22 DIAGNOSIS — K810 Acute cholecystitis: Secondary | ICD-10-CM | POA: Diagnosis not present

## 2023-12-02 ENCOUNTER — Ambulatory Visit (INDEPENDENT_AMBULATORY_CARE_PROVIDER_SITE_OTHER): Payer: BC Managed Care – PPO | Admitting: Student

## 2023-12-02 ENCOUNTER — Encounter (HOSPITAL_BASED_OUTPATIENT_CLINIC_OR_DEPARTMENT_OTHER): Payer: Self-pay | Admitting: Student

## 2023-12-02 VITALS — BP 124/88 | HR 68 | Temp 98.4°F | Ht 68.0 in | Wt 236.8 lb

## 2023-12-02 DIAGNOSIS — E039 Hypothyroidism, unspecified: Secondary | ICD-10-CM

## 2023-12-02 DIAGNOSIS — E66812 Obesity, class 2: Secondary | ICD-10-CM | POA: Diagnosis not present

## 2023-12-02 DIAGNOSIS — Z1211 Encounter for screening for malignant neoplasm of colon: Secondary | ICD-10-CM

## 2023-12-02 DIAGNOSIS — Z6836 Body mass index (BMI) 36.0-36.9, adult: Secondary | ICD-10-CM

## 2023-12-02 DIAGNOSIS — Z7689 Persons encountering health services in other specified circumstances: Secondary | ICD-10-CM | POA: Diagnosis not present

## 2023-12-02 NOTE — Assessment & Plan Note (Signed)
Will assess status with TSH. Plan to trial synthroid- per shared decision making with patient. Continue to monitor.

## 2023-12-02 NOTE — Assessment & Plan Note (Signed)
Suspect multifactorial with hypothyroidism- will ctm.

## 2023-12-02 NOTE — Progress Notes (Signed)
New Patient Office Visit  Subjective    Patient ID: Tammy Gibson, female    DOB: [birth year]  Age: 47 y.o. MRN: [id number]  CC:  Chief Complaint  Patient presents with   Establish Care    HPI Tammy Gibson presents to establish care. Prior PCP was at [facility]. Last physical was March 2023. She notes that she requires refills of thyroid medications.  Thyroid: Patient has history of graves disease but states that her history has been variable and it has been hard to control. She was later told that she had hypothyroidism. She has not been taking her levothyroxine, would like to recheck TSH today (last checked in 2022). Current symptoms include fatigue, weight gain, feeling cold and cold intolerance, swelling, depression, constipation, ocular symptoms (sees ophthalmology for thyroid eye dz). Patient denies feeling excessive energy, sweating, palpitation. Has been on synthroid, dessicated thyroid?, among others. Feels that she was not given a great try on synthroid, would like to try again since her insurance has not been paying for tirosint. Had a thyroid ultrasound Sept of 2022. History of growth on larynx- surgery to take off, is now hoarse. Max dose of thyroid medication was 200 mcg tirosint per day.   Tobacco use: former 30 years 2 ppd- 7 years smoking free Alcohol use: no Drug use: no Marital status: married Employment: Radio broadcast assistant  Screenings:  Colon Cancer: yes. Lung Cancer: not indicated  Breast Cancer: sees OBGYN  Cervical: sees OBGYN Diabetes: Indicated HLD: indicated  I spent greater than 45 minutes educating, managing, and charting with this patient.  Outpatient Encounter Medications as of 12/02/2023  Medication Sig   Acetaminophen (TYLENOL) 325 MG CAPS Take by mouth.   atorvastatin (LIPITOR) 10 MG tablet Take 1 tablet (10 mg total) by mouth daily.   Cholecalciferol (VITAMIN D3) 10000 UNITS capsule Take 10,000 Units by mouth daily.   dicyclomine (BENTYL) 20 MG tablet  Take 1 tablet (20 mg total) by mouth 4 (four) times daily -  before meals and at bedtime.   glycopyrrolate (ROBINUL) 1 MG tablet TAKE 1 TABLET (1 MG TOTAL) BY MOUTH 2 (TWO) TIMES DAILY AS NEEDED.   Levothyroxine Sodium (TIROSINT) 200 MCG CAPS Take 200 mcg by mouth daily. NEED APPOINTMENT FOR FURTHER REFILLS   OVER THE COUNTER MEDICATION Hemorrhoid cream daily   pantoprazole (PROTONIX) 40 MG tablet TAKE 1 TABLET (40 MG TOTAL) BY MOUTH 2 (TWO) TIMES DAILY BEFORE A MEAL.   PEG-KCl-NaCl-NaSulf-Na Asc-C (PLENVU) 140 g SOLR Take 1 kit by mouth as directed.   phentermine 37.5 MG capsule Take 1/2 to 1 tablet by mouth daily for dieting and weightloss.   TIROSINT 175 MCG CAPS Take 175 mcg by mouth daily.   traZODone (DESYREL) 50 MG tablet 1/2-1 tablet for sleep   triamcinolone ointment (KENALOG) 0.1 % Apply 1 application 2 (two) times daily topically.   [DISCONTINUED] CHANTIX STARTING MONTH PAK 0.5 MG X 11 & 1 MG X 42 tablet See admin instructions. follow package directions   No facility-administered encounter medications on file as of 12/02/2023.    Past Medical History:  Diagnosis Date   Abdominal pain    Adenomyosis    Bell's palsy    Dysmenorrhea    Dyspareunia    Elevated LFTs    Endometriosis    Hyperlipidemia    Infertility, female    Thyroid disease    pt was hypo now hyperthyroid   Urinary incontinence    Vitamin D deficiency  Past Surgical History:  Procedure Laterality Date   ECTOPIC PREGNANCY SURGERY  1997   RIGHT TUBE   IUD REMOVAL  2013   mirena,only in for 6 months   SALPINGECTOMY Right     Family History  Problem Relation Age of Onset   Hypertension Father    Hyperlipidemia Father    Diabetes Father    Thyroid disease Father    Thyroid disease Mother    Breast cancer Maternal Aunt    Thyroid disease Sister    Cancer - Colon Paternal Grandfather    Thyroid disease Paternal Grandmother        Hashimoto's    Social History   Socioeconomic History    Marital status: Married    Spouse name: Not on file   Number of children: Not on file   Years of education: Not on file   Highest education level: Not on file  Occupational History   Not on file  Tobacco Use   Smoking status: Former    Current packs/day: 0.25    Average packs/day: 0.3 packs/day for 30.0 years (7.5 ttl pk-yrs)    Types: Cigarettes   Smokeless tobacco: Never   Tobacco comments:    smokes about 2-3 cigarettes a day  Substance and Sexual Activity   Alcohol use: No    Alcohol/week: 0.0 standard drinks of alcohol    Comment: Patient stopped alcohol due to elevated liver enzymes.   Drug use: No   Sexual activity: Yes    Partners: Male    Birth control/protection: None  Other Topics Concern   Not on file  Social History Narrative   Not on file   Social Drivers of Health   Financial Resource Strain: Not on file  Food Insecurity: Not on file  Transportation Needs: Not on file  Physical Activity: Not on file  Stress: Not on file  Social Connections: Not on file  Intimate Partner Violence: Not on file    ROS Per hpi      Objective    BP 124/88   Pulse 68   Temp 98.4 F (36.9 C) (Oral)   Ht 5\' 8"  (1.727 m)   Wt 236 lb 12.8 oz (107.4 kg)   LMP 11/11/2023   SpO2 95%   BMI 36.01 kg/m   Physical Exam Constitutional:      General: She is not in acute distress.    Appearance: Normal appearance. She is not ill-appearing.  HENT:     Head: Normocephalic and atraumatic.     Right Ear: External ear normal.     Left Ear: External ear normal.     Nose: Nose normal.     Mouth/Throat:     Mouth: Mucous membranes are moist.     Pharynx: Oropharynx is clear.  Eyes:     General: No scleral icterus.    Extraocular Movements: Extraocular movements intact.     Conjunctiva/sclera: Conjunctivae normal.     Pupils: Pupils are equal, round, and reactive to light.  Neck:     Vascular: No carotid bruit.  Cardiovascular:     Rate and Rhythm: Normal rate and  regular rhythm.     Pulses: Normal pulses.     Heart sounds: Normal heart sounds. No murmur heard.    No friction rub.  Pulmonary:     Effort: Pulmonary effort is normal. No respiratory distress.     Breath sounds: Normal breath sounds. No wheezing, rhonchi or rales.  Musculoskeletal:  General: Normal range of motion.     Cervical back: Neck supple.     Right lower leg: No edema.     Left lower leg: No edema.  Lymphadenopathy:     Cervical: No cervical adenopathy.  Neurological:     Mental Status: She is alert.        Assessment & Plan:   Encounter to establish care -     CBC with Differential/Platelet; Future -     Comprehensive metabolic panel; Future -     Lipid panel; Future -     Hemoglobin A1c; Future  Acquired hypothyroidism Assessment & Plan: Will assess status with TSH. Plan to trial synthroid- per shared decision making with patient. Continue to monitor.  Orders: -     CBC with Differential/Platelet; Future -     Comprehensive metabolic panel; Future -     Lipid panel; Future -     Hemoglobin A1c; Future -     TSH; Future  Colon cancer screening -     Ambulatory referral to Gastroenterology  Class 2 obesity without serious comorbidity with body mass index (BMI) of 36.0 to 36.9 in adult, unspecified obesity type Assessment & Plan: Suspect multifactorial with hypothyroidism- will ctm.  Orders: -     CBC with Differential/Platelet; Future -     Comprehensive metabolic panel; Future -     Lipid panel; Future -     Hemoglobin A1c; Future    Return in about 4 weeks (around 12/30/2023) for Chronic Followup- thyroid.   Teryl Lucy Reinhart Saulters, PA-C

## 2023-12-02 NOTE — Patient Instructions (Addendum)
It was nice to see you today!  As we discussed in clinic we will be ordering a TSH today along with the labs for your physical.  Once I have your TSH results we will discuss starting levothyroxine in the form of Synthroid.  I will have you set up to follow-up with me in around 6 weeks which puts [place] in January.  I have also ordered a colonoscopy for you, they should contact you to get that set up.  Please go to a [facility] whenever you can to have your labs drawn.  If you have any problems before your next visit feel free to message me via MyChart (minor issues or questions) or call the office, otherwise you may reach out to schedule an office visit.  Thank you! Gerilyn Pilgrim Isabel Ardila, PA-C

## 2023-12-03 ENCOUNTER — Other Ambulatory Visit (HOSPITAL_BASED_OUTPATIENT_CLINIC_OR_DEPARTMENT_OTHER): Payer: Self-pay | Admitting: Student

## 2023-12-03 DIAGNOSIS — R7989 Other specified abnormal findings of blood chemistry: Secondary | ICD-10-CM

## 2023-12-03 DIAGNOSIS — E039 Hypothyroidism, unspecified: Secondary | ICD-10-CM

## 2023-12-03 DIAGNOSIS — E782 Mixed hyperlipidemia: Secondary | ICD-10-CM

## 2023-12-03 LAB — LIPID PANEL
Chol/HDL Ratio: 6.4 {ratio} — ABNORMAL HIGH (ref 0.0–4.4)
Cholesterol, Total: 439 mg/dL — ABNORMAL HIGH (ref 100–199)
HDL: 69 mg/dL (ref >39)
LDL Chol Calc (NIH): 330 mg/dL — ABNORMAL HIGH (ref 0–99)
Triglycerides: 189 mg/dL — ABNORMAL HIGH (ref 0–149)
VLDL Cholesterol Cal: 40 mg/dL (ref 5–40)

## 2023-12-03 LAB — HEMOGLOBIN A1C
Est. average glucose Bld gHb Est-mCnc: 117 mg/dL
Hgb A1c MFr Bld: 5.7 % — ABNORMAL HIGH (ref 4.8–5.6)

## 2023-12-03 LAB — CBC WITH DIFFERENTIAL/PLATELET
Basophils Absolute: 0.1 10*3/uL (ref 0.0–0.2)
Basos: 2 %
EOS (ABSOLUTE): 0.3 10*3/uL (ref 0.0–0.4)
Eos: 4 %
Hematocrit: 39.1 % (ref 34.0–46.6)
Hemoglobin: 12.4 g/dL (ref 11.1–15.9)
Immature Grans (Abs): 0 10*3/uL (ref 0.0–0.1)
Immature Granulocytes: 1 %
Lymphocytes Absolute: 2.2 10*3/uL (ref 0.7–3.1)
Lymphs: 36 %
MCH: 29 pg (ref 26.6–33.0)
MCHC: 31.7 g/dL (ref 31.5–35.7)
MCV: 91 fL (ref 79–97)
Monocytes Absolute: 0.4 10*3/uL (ref 0.1–0.9)
Monocytes: 6 %
Neutrophils Absolute: 3.2 10*3/uL (ref 1.4–7.0)
Neutrophils: 51 %
Platelets: 258 10*3/uL (ref 150–450)
RBC: 4.28 x10E6/uL (ref 3.77–5.28)
RDW: 14 % (ref 11.7–15.4)
WBC: 6.1 10*3/uL (ref 3.4–10.8)

## 2023-12-03 LAB — COMPREHENSIVE METABOLIC PANEL
ALT: 192 [IU]/L — ABNORMAL HIGH (ref 0–32)
AST: 177 [IU]/L — ABNORMAL HIGH (ref 0–40)
Albumin: 4.7 g/dL (ref 3.9–4.9)
Alkaline Phosphatase: 76 [IU]/L (ref 44–121)
BUN/Creatinine Ratio: 9 (ref 9–23)
BUN: 10 mg/dL (ref 6–24)
Bilirubin Total: 0.7 mg/dL (ref 0.0–1.2)
CO2: 22 mmol/L (ref 20–29)
Calcium: 9.7 mg/dL (ref 8.7–10.2)
Chloride: 99 mmol/L (ref 96–106)
Creatinine, Ser: 1.17 mg/dL — ABNORMAL HIGH (ref 0.57–1.00)
Globulin, Total: 2.7 g/dL (ref 1.5–4.5)
Glucose: 82 mg/dL (ref 70–99)
Potassium: 4.1 mmol/L (ref 3.5–5.2)
Sodium: 138 mmol/L (ref 134–144)
Total Protein: 7.4 g/dL (ref 6.0–8.5)
eGFR: 58 mL/min/{1.73_m2} — ABNORMAL LOW (ref >59)

## 2023-12-03 LAB — TSH: TSH: 74.2 u[IU]/mL — ABNORMAL HIGH (ref 0.450–4.500)

## 2023-12-03 MED ORDER — LEVOTHYROXINE SODIUM 150 MCG PO TABS: 150.0000 ug | ORAL_TABLET | Freq: Every day | ORAL | 3 refills | Status: DC

## 2023-12-03 MED ORDER — ATORVASTATIN CALCIUM 20 MG PO TABS: 20.0000 mg | ORAL_TABLET | Freq: Every day | ORAL | 1 refills | Status: DC

## 2023-12-03 NOTE — Progress Notes (Signed)
Called patient and discussed labs. Discussed Atorvastatin, patient has been on before. Discussed levothyroxine including to take 30 minutes before breakfast with water. Patient understands need for labs imaging, referrals, and medications. Patient is agreeable. All questions answered.  Gerilyn Pilgrim Alece Koppel, PA-C

## 2023-12-04 ENCOUNTER — Other Ambulatory Visit (HOSPITAL_BASED_OUTPATIENT_CLINIC_OR_DEPARTMENT_OTHER): Payer: Self-pay

## 2023-12-04 ENCOUNTER — Encounter (HOSPITAL_BASED_OUTPATIENT_CLINIC_OR_DEPARTMENT_OTHER): Payer: Self-pay | Admitting: Student

## 2023-12-04 DIAGNOSIS — R7989 Other specified abnormal findings of blood chemistry: Secondary | ICD-10-CM | POA: Diagnosis not present

## 2023-12-05 ENCOUNTER — Inpatient Hospital Stay
Admission: RE | Admit: 2023-12-05 | Discharge: 2023-12-05 | Discharge status: Home / Self Care | Payer: BC Managed Care – PPO | Source: Ambulatory Visit | Attending: Student

## 2023-12-05 DIAGNOSIS — E782 Mixed hyperlipidemia: Secondary | ICD-10-CM

## 2023-12-05 DIAGNOSIS — R7989 Other specified abnormal findings of blood chemistry: Secondary | ICD-10-CM

## 2023-12-05 DIAGNOSIS — K769 Liver disease, unspecified: Secondary | ICD-10-CM | POA: Diagnosis not present

## 2023-12-05 LAB — ACUTE VIRAL HEPATITIS (HAV, HBV, HCV)
HCV Ab: NONREACTIVE
Hep A IgM: NEGATIVE
Hep B C IgM: NEGATIVE
Hepatitis B Surface Ag: NEGATIVE

## 2023-12-05 LAB — IRON,TIBC AND FERRITIN PANEL
Ferritin: 130 ng/mL (ref 15–150)
Iron Saturation: 14 % — ABNORMAL LOW (ref 15–55)
Iron: 53 ug/dL (ref 27–159)
Total Iron Binding Capacity: 378 ug/dL (ref 250–450)
UIBC: 325 ug/dL (ref 131–425)

## 2023-12-05 LAB — ANTI-SMOOTH MUSCLE ANTIBODY, IGG: Smooth Muscle Ab: 30 U — ABNORMAL HIGH (ref 0–19)

## 2023-12-05 LAB — ANA W/REFLEX: Anti Nuclear Antibody (ANA): NEGATIVE

## 2023-12-05 LAB — CERULOPLASMIN: Ceruloplasmin: 21.3 mg/dL (ref 19.0–39.0)

## 2023-12-05 LAB — HCV INTERPRETATION

## 2023-12-30 ENCOUNTER — Encounter (HOSPITAL_BASED_OUTPATIENT_CLINIC_OR_DEPARTMENT_OTHER): Payer: Self-pay | Admitting: Student

## 2023-12-30 ENCOUNTER — Encounter (HOSPITAL_BASED_OUTPATIENT_CLINIC_OR_DEPARTMENT_OTHER): Payer: Self-pay

## 2023-12-30 ENCOUNTER — Ambulatory Visit (INDEPENDENT_AMBULATORY_CARE_PROVIDER_SITE_OTHER): Payer: BC Managed Care – PPO | Admitting: Student

## 2023-12-30 VITALS — BP 117/80 | HR 80 | Temp 97.6°F | Ht 68.0 in | Wt 228.6 lb

## 2023-12-30 DIAGNOSIS — N179 Acute kidney failure, unspecified: Secondary | ICD-10-CM

## 2023-12-30 DIAGNOSIS — L299 Pruritus, unspecified: Secondary | ICD-10-CM | POA: Insufficient documentation

## 2023-12-30 DIAGNOSIS — E782 Mixed hyperlipidemia: Secondary | ICD-10-CM

## 2023-12-30 DIAGNOSIS — Z79899 Other long term (current) drug therapy: Secondary | ICD-10-CM

## 2023-12-30 DIAGNOSIS — E66811 Obesity, class 1: Secondary | ICD-10-CM

## 2023-12-30 DIAGNOSIS — L853 Xerosis cutis: Secondary | ICD-10-CM | POA: Insufficient documentation

## 2023-12-30 DIAGNOSIS — E039 Hypothyroidism, unspecified: Secondary | ICD-10-CM

## 2023-12-30 DIAGNOSIS — F419 Anxiety disorder, unspecified: Secondary | ICD-10-CM | POA: Insufficient documentation

## 2023-12-30 HISTORY — DX: Acute kidney failure, unspecified: N17.9

## 2023-12-30 MED ORDER — ATORVASTATIN CALCIUM 40 MG PO TABS: 40.0000 mg | ORAL_TABLET | Freq: Every day | ORAL | 3 refills | Status: AC

## 2023-12-30 MED ORDER — HYDROXYZINE PAMOATE 25 MG PO CAPS
25.0000 mg | ORAL_CAPSULE | Freq: Three times a day (TID) | ORAL | 0 refills | Status: AC | PRN
Start: 2023-12-30 — End: ?

## 2023-12-30 NOTE — Assessment & Plan Note (Addendum)
 Continue synthroid, seeing improvement as per HPI. Assess tsh and free T4 today. Having symptoms consistent with hyperthyroidism including dry skin, anxiety, and weight loss. I suspect that I will be titrating the medication downwards.

## 2023-12-30 NOTE — Assessment & Plan Note (Addendum)
 I suspect that this is related to thyroid as it worsened with levothyroxine.

## 2023-12-30 NOTE — Assessment & Plan Note (Addendum)
 Likely related to dry skin as above. Areas itching were noted to be scaly. Went over techniques for restoration of skin barrier. Suspect dry skin is multifactorial related to overcorrection of thyroid  function and dry, cold weather. Will further assess. Provided with atarax  to help with itching and concurrent anxiety. Has appointment with dermatology soon, patient is to discuss then as well.

## 2023-12-30 NOTE — Progress Notes (Signed)
 Established Patient Office Visit  Subjective   Patient ID: KENYETTE GUNDY, female    DOB: [birth year]  Age: 48 y.o. MRN: [id number]  Chief Complaint  Patient presents with   Medical Management of Chronic Issues    Feeling a lot better. Hard to get motivated. Itchy all over.     HPI  Hypothyroidism - Started on synthroid  since last visit. Taking medication regularly in the AM away from food and vitamins, etc. Patient notes that she was taking it with atorvastatin  but that she will begin to take that med at night. Patient notes increased anxiety (can feel heart speed up occasionally- but no true palpitations noted), weight loss, feels like disorganization has peaked. Patient notes that she feels much better today overall and has noticed that she is less puffy on both her face and her legs.  Previous visit, patient notes: fatigue, weight gain, feeling cold and cold intolerance, swelling, depression, constipation, ocular symptoms (sees ophthalmology for thyroid  eye dz)  Hyperlipidemia - Previously started on atorvastatin  20mg , discussed increase to 40mg . Tolerating stating well with no myalgias or significant side effects. Sent to lipid clinic- made appt for 04/28/24, will attempt to correct lipid levels with thyroid  correction and cholesterol medications until then. Discussed not to take alongside thyroid  med. Lab Results  Component Value Date   CHOL 439 (H) 12/02/2023   HDL 69 12/02/2023   LDLCALC 330 (H) 12/02/2023   TRIG 189 (H) 12/02/2023   CHOLHDL 6.4 (H) 12/02/2023   AKI- At last visit, patient's creatinine was up to 1.17 last cr was from 2019 so unreliable but will CTM. Discussed water intake. Discussed possible etiologies. Will reassess at this visit.  Itching- patient noted that onset was in the last week. No s/s of PBC on RUQ u/s. Non- cholestatic pattern on LFTs. No history of increased bili to cholestatic pathology. Does not remember this happening before.  Noted areas  include chest, back, arms (including flexural surfaces), and lower extremities.  Discussed possible etiology, and educated patient on proper skin care.  Discussed proper products to be utilizing and encouraged hydration.  BP is fine today- 117/80.    ROS Per hpi    Objective:     BP 117/80 (BP Location: Right Arm, Patient Position: Sitting, Cuff Size: Large)   Pulse 80   Temp 97.6 F (36.4 C) (Oral)   Ht 5' 8 (1.727 m)   Wt 228 lb 9.6 oz (103.7 kg)   LMP 12/03/2023 (Exact Date)   SpO2 98%   BMI 34.76 kg/m    Physical Exam Constitutional:      General: She is not in acute distress.    Appearance: Normal appearance. She is not ill-appearing.  HENT:     Head: Normocephalic and atraumatic.     Mouth/Throat:     Mouth: Mucous membranes are moist.     Pharynx: Oropharynx is clear.  Eyes:     General: No scleral icterus.    Extraocular Movements: Extraocular movements intact.     Conjunctiva/sclera: Conjunctivae normal.     Pupils: Pupils are equal, round, and reactive to light.     Comments: No periorbital edema. Looks better since last visit.  Cardiovascular:     Rate and Rhythm: Normal rate and regular rhythm.     Pulses: Normal pulses.     Heart sounds: Normal heart sounds. No murmur heard.    No friction rub.  Pulmonary:     Effort: Pulmonary effort is normal. No respiratory  distress.     Breath sounds: Normal breath sounds. No wheezing, rhonchi or rales.  Musculoskeletal:        General: No swelling, tenderness or deformity. Normal range of motion.     Right lower leg: No edema.     Left lower leg: No edema.  Skin:    General: Skin is dry.     Comments: Scaly type lesions on back, chest, arms, and legs. Consistent with likely dry skin.  Neurological:     General: No focal deficit present.     Mental Status: She is alert.  Psychiatric:        Mood and Affect: Mood normal.        Behavior: Behavior normal.      No results found for any visits on  12/30/23.    The ASCVD Risk score (Arnett DK, et al., 2019) failed to calculate for the following reasons:   The valid total cholesterol range is 130 to 320 mg/dL    Assessment & Plan:   Acquired hypothyroidism Assessment & Plan: Continue synthroid , seeing improvement as per HPI. Assess tsh and free T4 today. Having symptoms consistent with hyperthyroidism including dry skin, anxiety, and weight loss. I suspect that I will be titrating the medication downwards.  Orders: -     TSH + free T4 -     Comprehensive metabolic panel  Obesity (BMI 30.0-34.9) Assessment & Plan: Continue weight loss efforts.  Orders: -     TSH + free T4 -     Comprehensive metabolic panel  Medication management  Mixed hyperlipidemia Assessment & Plan: Referred to lipid clinic-appointment in May. Increase atorvastatin  to 40 mg daily, patient is to take at night so that it is not taken alongside levothyroxine .  Orders: -     Lipid panel -     Atorvastatin  Calcium ; Take 1 tablet (40 mg total) by mouth daily.  Dispense: 90 tablet; Refill: 3  Itching Assessment & Plan: Likely related to dry skin as above. Areas itching were noted to be scaly. Went over techniques for restoration of skin barrier. Suspect dry skin is multifactorial related to overcorrection of thyroid  function and dry, cold weather. Will further assess. Provided with atarax  to help with itching and concurrent anxiety. Has appointment with dermatology soon, patient is to discuss then as well.  Orders: -     hydrOXYzine  Pamoate; Take 1 capsule (25 mg total) by mouth every 8 (eight) hours as needed for anxiety or itching.  Dispense: 30 capsule; Refill: 0  Dry skin Assessment & Plan: Discussed therapies to aid restoration of skin barrier.  Patches of dry skin on back, chest, arms, and legs. 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 Skin is not consistent with such. Suspect dry skin is in relation to thyroid   medication. Has appointment with dermatology soon, patient is to discuss then as well.   Anxiety Assessment & Plan: I suspect that this is related to thyroid  as it worsened with levothyroxine .  Orders: -     hydrOXYzine  Pamoate; Take 1 capsule (25 mg total) by mouth every 8 (eight) hours as needed for anxiety or itching.  Dispense: 30 capsule; Refill: 0  AKI (acute kidney injury) (HCC) Assessment & Plan: Last creatinine was 0.72 in 2019 (this is likely unreliable). Most recent creatinine had bumped to 1.17 indicating possible AKI.  Discussed possible etiologies with patient.  Encouraged fluid intake.  Will recheck today.    I have spent greater than  40 minutes charting, educating, diagnosing and managing this patient for this visit.   Return in about 6 weeks (around 02/10/2024) for Chronic Followup.    Kaius Daino T Jayel Scaduto, PA-C

## 2023-12-30 NOTE — Patient Instructions (Signed)
 It was nice to see you today!  As we discussed in clinic I want you to increase your atorvastatin  to 40mg  daily, I would advise you to take that at night so that it is not taken with your levothyroxine .  I will communicate with you once we get your thyroid  labs back.   Please make sure to hydrate!  I would also encourage you to incorporate a more gentle skin cleanser into your routine, utilize over the counter hydrocortisone cream, and make sure to use a gentle lotion like cetaphil as you get out of the shower. This can help hydrate your skin and repair the barrier.  I will call in a medication to help reduce anxiety and itching.  If you have any problems before your next visit feel free to message me via MyChart (minor issues or questions) or call the office, otherwise you may reach out to schedule an office visit.  Thank you! Ermias Tomeo, PA-C

## 2023-12-30 NOTE — Assessment & Plan Note (Addendum)
 Last creatinine was 0.72 in 2019 (this is likely unreliable). Most recent creatinine had bumped to 1.17 indicating possible AKI.  Discussed possible etiologies with patient.  Encouraged fluid intake.  Will recheck today.

## 2023-12-30 NOTE — Assessment & Plan Note (Signed)
 Continue weight loss efforts.

## 2023-12-30 NOTE — Assessment & Plan Note (Signed)
 Referred to lipid clinic-appointment in May. Increase atorvastatin to 40 mg daily, patient is to take at night so that it is not taken alongside levothyroxine.

## 2023-12-30 NOTE — Assessment & Plan Note (Addendum)
 Discussed therapies to aid restoration of skin barrier.  Patches of dry skin on back, chest, arms, and legs. 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 Skin is not consistent with such. Suspect dry skin is in relation to thyroid  medication. Has appointment with dermatology soon, patient is to discuss then as well.

## 2023-12-31 LAB — COMPREHENSIVE METABOLIC PANEL
ALT: 32 [IU]/L (ref 0–32)
AST: 26 [IU]/L (ref 0–40)
Albumin: 4.4 g/dL (ref 3.9–4.9)
Alkaline Phosphatase: 93 [IU]/L (ref 44–121)
BUN/Creatinine Ratio: 19 (ref 9–23)
BUN: 15 mg/dL (ref 6–24)
Bilirubin Total: 0.4 mg/dL (ref 0.0–1.2)
CO2: 24 mmol/L (ref 20–29)
Calcium: 9.5 mg/dL (ref 8.7–10.2)
Chloride: 104 mmol/L (ref 96–106)
Creatinine, Ser: 0.78 mg/dL (ref 0.57–1.00)
Globulin, Total: 2.6 g/dL (ref 1.5–4.5)
Glucose: 92 mg/dL (ref 70–99)
Potassium: 4.4 mmol/L (ref 3.5–5.2)
Sodium: 142 mmol/L (ref 134–144)
Total Protein: 7 g/dL (ref 6.0–8.5)
eGFR: 94 mL/min/{1.73_m2} (ref >59)

## 2023-12-31 LAB — TSH+FREE T4
Free T4: 1.76 ng/dL (ref 0.82–1.77)
TSH: 1.1 u[IU]/mL (ref 0.450–4.500)

## 2023-12-31 LAB — LIPID PANEL
Chol/HDL Ratio: 2.5 {ratio} (ref 0.0–4.4)
Cholesterol, Total: 140 mg/dL (ref 100–199)
HDL: 57 mg/dL (ref >39)
LDL Chol Calc (NIH): 67 mg/dL (ref 0–99)
Triglycerides: 83 mg/dL (ref 0–149)
VLDL Cholesterol Cal: 16 mg/dL (ref 5–40)

## 2024-01-22 DIAGNOSIS — L814 Other melanin hyperpigmentation: Secondary | ICD-10-CM | POA: Diagnosis not present

## 2024-01-22 DIAGNOSIS — D225 Melanocytic nevi of trunk: Secondary | ICD-10-CM | POA: Diagnosis not present

## 2024-01-22 DIAGNOSIS — L578 Other skin changes due to chronic exposure to nonionizing radiation: Secondary | ICD-10-CM | POA: Diagnosis not present

## 2024-01-22 DIAGNOSIS — L820 Inflamed seborrheic keratosis: Secondary | ICD-10-CM | POA: Diagnosis not present

## 2024-01-22 DIAGNOSIS — D485 Neoplasm of uncertain behavior of skin: Secondary | ICD-10-CM | POA: Diagnosis not present

## 2024-02-10 ENCOUNTER — Encounter (HOSPITAL_BASED_OUTPATIENT_CLINIC_OR_DEPARTMENT_OTHER): Payer: Self-pay | Admitting: Student

## 2024-02-10 ENCOUNTER — Ambulatory Visit (INDEPENDENT_AMBULATORY_CARE_PROVIDER_SITE_OTHER): Payer: BC Managed Care – PPO | Admitting: Student

## 2024-02-10 VITALS — BP 122/82 | HR 82 | Temp 97.7°F | Ht 68.0 in | Wt 227.7 lb

## 2024-02-10 DIAGNOSIS — Z5181 Encounter for therapeutic drug level monitoring: Secondary | ICD-10-CM

## 2024-02-10 DIAGNOSIS — R311 Benign essential microscopic hematuria: Secondary | ICD-10-CM | POA: Diagnosis not present

## 2024-02-10 DIAGNOSIS — E039 Hypothyroidism, unspecified: Secondary | ICD-10-CM | POA: Diagnosis not present

## 2024-02-10 DIAGNOSIS — L853 Xerosis cutis: Secondary | ICD-10-CM | POA: Diagnosis not present

## 2024-02-10 DIAGNOSIS — E782 Mixed hyperlipidemia: Secondary | ICD-10-CM

## 2024-02-10 DIAGNOSIS — Z79899 Other long term (current) drug therapy: Secondary | ICD-10-CM

## 2024-02-10 DIAGNOSIS — Z1231 Encounter for screening mammogram for malignant neoplasm of breast: Secondary | ICD-10-CM

## 2024-02-10 DIAGNOSIS — F411 Generalized anxiety disorder: Secondary | ICD-10-CM | POA: Insufficient documentation

## 2024-02-10 LAB — POCT URINALYSIS DIPSTICK
Bilirubin, UA: NEGATIVE
Glucose, UA: NEGATIVE
Ketones, UA: NEGATIVE
Leukocytes, UA: NEGATIVE
Nitrite, UA: NEGATIVE
Protein, UA: NEGATIVE
Spec Grav, UA: 1.02 (ref 1.010–1.025)
Urobilinogen, UA: 0.2 U/dL
pH, UA: 5.5 (ref 5.0–8.0)

## 2024-02-10 MED ORDER — ESCITALOPRAM OXALATE 5 MG PO TABS: 5.0000 mg | ORAL_TABLET | Freq: Every day | ORAL | 6 refills | Status: DC

## 2024-02-10 NOTE — Assessment & Plan Note (Signed)
 Continue atorvastatin 40 mg daily.  Plan to recheck panel in about 3 months and assess for possible scaling down of statin therapy.  Due to occasional hip pain, will order CK to make sure that this is not a masked myalgia.

## 2024-02-10 NOTE — Assessment & Plan Note (Signed)
 Start Lexapro 5 mg.  Follow-up in 6 weeks to assess progress.  Provided with possible side effect profile.

## 2024-02-10 NOTE — Assessment & Plan Note (Signed)
 Mostly resolved.  Previously discussed OTC therapies and restoration of skin barrier. Recently saw dermatology, they have her using a tea tree oil/mint body wash that she feels has helped. Patient notes that she is still getting some itching but Atarax does help.

## 2024-02-10 NOTE — Assessment & Plan Note (Addendum)
 Continue Synthroid at current dose.  Last labs indicate that she is within range.  Per patient, she is continuing to have some slight weight loss, this has not been seen clinically, she is only lost about 1 pound since her last visit.  She stated that anxiety has been stable since the start of her thyroid medication.

## 2024-02-10 NOTE — Assessment & Plan Note (Signed)
 History of kidney stone. Patient was having gross hematuria about a week ago- I suspect that this could have been another kidney stone, stated that pain went away after urination and bleeding has mostly stopped.  UA today revealed only trace hematuria.  Will recheck in a couple weeks to make sure that hematuria has resolved, and if not we will further workup.

## 2024-02-10 NOTE — Patient Instructions (Addendum)
 It was nice to see you today!  As we discussed in clinic I will have you come back in 2 weeks for a follow up urine study.  Let me know how your anxiety is in 6 weeks or if you have any issues. If you would like to go to counseling, see below.   Counseling Resource: [facility] [address]  [place], [place] [address]  Phone: [phone]  Fax: [phone]   Lexapro is an SSRI. The SSRI class can have side effects such as weight gain, sexual dysfunction, insomnia, headache, nausea. These medications are generally effective at alleviating symptoms of anxiety and/or depression. Let me know if significant side effects do occur.   If you have any problems before your next visit feel free to message me via MyChart (minor issues or questions) or call the office, otherwise you may reach out to schedule an office visit.  Thank you! Gerilyn Pilgrim Lakeyn Dokken, PA-C

## 2024-02-10 NOTE — Progress Notes (Signed)
 Established Patient Office Visit  Subjective   Patient ID: Tammy Gibson, female    DOB: Apr 24, 1976  Age: 48 y.o. MRN: [id number]  Chief Complaint  Patient presents with   Medical Management of Chronic Issues    Here for follow up.   Hematuria    Had some pain on right side and towards back last week and saw some blood. Has hx of kidney stones. Took azo but last pill taken was Saturday. Supposed to be starting period.    HPI  Hypothyroidism - No recent change to skin, hair, or energy levels. No noted issues with medication.  Patient notes that she is now not losing as much hair since being started on levothyroxine. Notes that anxiety is about stable. She is having more energy.  Hyperlipidemia - tolerating stating well with no myalgias or significant side effects-aside from as below. Suspect that previous HLD was secondary to uncontrolled hypothyroidism.  Patient has been instructed that she can cancel lipid clinic appointment. Lab Results  Component Value Date   CHOL 140 12/30/2023   HDL 57 12/30/2023   LDLCALC 67 12/30/2023   TRIG 83 12/30/2023   CHOLHDL 2.5 12/30/2023   AKI- resolved. eGFR was back to normal levels at last lab visit.  Itching- patient is still dealing with the itching noted at her last visit.  At her last visit I provided with atarax- notes that atarax has helped but states that it does cause her to get tired.  She states that she did go to dermatology, and is now using a tea tea tree/mint body wash.  Patient has seen serious improvement since starting this.  Anxiety- notes that she is no longer able to enjoy some activities due to anxiety such as he has been parks or riding motorcycles.  She does not feel like her anxiety has increased with thyroid medication.  Treat feels like she cannot get anything done when she has spells of anxiety. Does not like shopping due to too many people.  Patient is agreeable to beginning treatment today.     12/30/2023    1:13 PM  12/02/2023    1:41 PM  GAD 7 : Generalized Anxiety Score  Nervous, Anxious, on Edge 3 2  Control/stop worrying 3 2  Worry too much - different things 3 2  Trouble relaxing 1 1  Restless 1 0  Easily annoyed or irritable 1 1  Afraid - awful might happen 3 2  Total GAD 7 Score 15 10  Anxiety Difficulty Somewhat difficult Somewhat difficult   Acute issue:  Pain in RLQ on last Monday/Tuesday morning, characterized as a sharp stabbing pain. Notes that this felt the same as a kidney stone she had in the past. Notes that she tried azo, feels that it did not help much. Has not felt a stone pass. But after she peed that same morning she went away. Notes that the blood in her urine has been steadily decreasing.  No dysuria, no odor in urine (except the one time she urinated). Has had some joint pain for about a month, will grab a CK just to make sure that this is not related to the statin therapy.  She denies weakness.      ROS Negative unless indicated in HPI.    Objective:     BP 122/82 (BP Location: Right Arm, Patient Position: Sitting, Cuff Size: Large)   Pulse 82   Temp 97.7 F (36.5 C) (Oral)   Ht 5\' 8"  (1.727  m)   Wt 227 lb 11.2 oz (103.3 kg)   LMP 01/20/2024 (Approximate)   SpO2 98%   BMI 34.62 kg/m    Physical Exam Constitutional:      General: She is not in acute distress.    Appearance: Normal appearance. She is not ill-appearing.  HENT:     Head: Normocephalic and atraumatic.     Right Ear: External ear normal.     Left Ear: External ear normal.     Nose: Nose normal.     Mouth/Throat:     Mouth: Mucous membranes are moist.     Pharynx: Oropharynx is clear.  Eyes:     General: No scleral icterus.    Extraocular Movements: Extraocular movements intact.     Conjunctiva/sclera: Conjunctivae normal.     Pupils: Pupils are equal, round, and reactive to light.  Cardiovascular:     Rate and Rhythm: Normal rate and regular rhythm.     Pulses: Normal pulses.      Heart sounds: Normal heart sounds. No murmur heard.    No friction rub.  Pulmonary:     Effort: Pulmonary effort is normal. No respiratory distress.     Breath sounds: Normal breath sounds. No wheezing, rhonchi or rales.  Abdominal:     General: There is no distension.  Musculoskeletal:        General: Normal range of motion.     Right lower leg: No edema.     Left lower leg: No edema.  Skin:    General: Skin is warm and dry.     Coloration: Skin is not jaundiced or pale.  Neurological:     General: No focal deficit present.     Mental Status: She is alert.     Motor: No weakness.     Gait: Gait normal.     Deep Tendon Reflexes: Reflexes normal.  Psychiatric:        Mood and Affect: Mood normal.        Behavior: Behavior normal.      Results for orders placed or performed in visit on 02/10/24  POCT urinalysis dipstick  Result Value Ref Range   Color, UA DARK YELLOW    Clarity, UA CLEAR    Glucose, UA Negative Negative   Bilirubin, UA NEGATIVE    Ketones, UA NEGATIVE    Spec Grav, UA 1.020 1.010 - 1.025   Blood, UA TRACE-INTACT    pH, UA 5.5 5.0 - 8.0   Protein, UA Negative Negative   Urobilinogen, UA 0.2 0.2 or 1.0 E.U./dL   Nitrite, UA NEGATIVE    Leukocytes, UA Negative Negative   Appearance CLEAR    Odor N/A       The 10-year ASCVD risk score (Arnett DK, et al., 2019) is: 0.5%    Assessment & Plan:   Acquired hypothyroidism Assessment & Plan: Continue Synthroid at current dose.  Last labs indicate that she is within range.  Per patient, she is continuing to have some slight weight loss, this has not been seen clinically, she is only lost about 1 pound since her last visit.  She stated that anxiety has been stable since the start of her thyroid medication.   GAD (generalized anxiety disorder) Assessment & Plan: Start Lexapro 5 mg.  Follow-up in 6 weeks to assess progress.  Provided with possible side effect profile.  Orders: -     Escitalopram Oxalate;  Take 1 tablet (5 mg total) by mouth daily.  Dispense: 30  tablet; Refill: 6  Benign essential microscopic hematuria Assessment & Plan: History of kidney stone. Patient was having gross hematuria about a week ago- I suspect that this could have been another kidney stone, stated that pain went away after urination and bleeding has mostly stopped.  UA today revealed only trace hematuria.  Will recheck in a couple weeks to make sure that hematuria has resolved, and if not we will further workup.  Orders: -     POCT urinalysis dipstick -     CK  Dry skin Assessment & Plan: Mostly resolved.  Previously discussed OTC therapies and restoration of skin barrier. Recently saw dermatology, they have her using a tea tree oil/mint body wash that she feels has helped. Patient notes that she is still getting some itching but Atarax does help.   Mixed hyperlipidemia Assessment & Plan: Continue atorvastatin 40 mg daily.  Plan to recheck panel in about 3 months and assess for possible scaling down of statin therapy.  Due to occasional hip pain, will order CK to make sure that this is not a masked myalgia.  Orders: -     CK  Screening mammogram for breast cancer -     3D Screening Mammogram, Left and Right; Future  Encounter for monitoring statin therapy -     CK   I have spent greater than 30 minutes charting, educating, diagnosing and managing this patient for this visit.   Return in about 3 months (around 05/09/2024) for Annual Physical.    Teryl Lucy Emir Nack, PA-C

## 2024-02-11 ENCOUNTER — Encounter (HOSPITAL_BASED_OUTPATIENT_CLINIC_OR_DEPARTMENT_OTHER): Payer: Self-pay | Admitting: Student

## 2024-02-11 LAB — CK: Total CK: 38 U/L (ref 32–182)

## 2024-02-18 ENCOUNTER — Inpatient Hospital Stay (HOSPITAL_BASED_OUTPATIENT_CLINIC_OR_DEPARTMENT_OTHER): Admission: RE | Admit: 2024-02-18 | Payer: BC Managed Care – PPO | Source: Ambulatory Visit | Admitting: Radiology

## 2024-02-19 ENCOUNTER — Encounter (HOSPITAL_BASED_OUTPATIENT_CLINIC_OR_DEPARTMENT_OTHER): Payer: Self-pay | Admitting: Radiology

## 2024-02-19 ENCOUNTER — Ambulatory Visit (HOSPITAL_BASED_OUTPATIENT_CLINIC_OR_DEPARTMENT_OTHER)
Admission: RE | Admit: 2024-02-19 | Discharge: 2024-02-19 | Discharge status: Home / Self Care | Source: Ambulatory Visit | Attending: Student | Admitting: Radiology

## 2024-02-19 DIAGNOSIS — Z1231 Encounter for screening mammogram for malignant neoplasm of breast: Secondary | ICD-10-CM | POA: Diagnosis not present

## 2024-02-24 ENCOUNTER — Other Ambulatory Visit (HOSPITAL_BASED_OUTPATIENT_CLINIC_OR_DEPARTMENT_OTHER): Payer: BC Managed Care – PPO

## 2024-02-24 NOTE — Progress Notes (Unsigned)
 Phone contact with Dr. Ranae Plumber' nurse explaining that the pt's screening mammo was abnormal and that a diagnostic needs to be ordered at [facility] or [facility] per pt and dr preference.

## 2024-02-25 ENCOUNTER — Other Ambulatory Visit (HOSPITAL_BASED_OUTPATIENT_CLINIC_OR_DEPARTMENT_OTHER): Payer: Self-pay | Admitting: Student

## 2024-02-25 DIAGNOSIS — R928 Other abnormal and inconclusive findings on diagnostic imaging of breast: Secondary | ICD-10-CM

## 2024-03-05 ENCOUNTER — Ambulatory Visit
Admission: RE | Admit: 2024-03-05 | Discharge: 2024-03-05 | Disposition: A | Discharge status: Home / Self Care | Source: Ambulatory Visit | Attending: Student | Admitting: Student

## 2024-03-05 ENCOUNTER — Other Ambulatory Visit (HOSPITAL_BASED_OUTPATIENT_CLINIC_OR_DEPARTMENT_OTHER): Payer: Self-pay | Admitting: Student

## 2024-03-05 DIAGNOSIS — F411 Generalized anxiety disorder: Secondary | ICD-10-CM

## 2024-03-05 DIAGNOSIS — R928 Other abnormal and inconclusive findings on diagnostic imaging of breast: Secondary | ICD-10-CM

## 2024-03-05 DIAGNOSIS — N6001 Solitary cyst of right breast: Secondary | ICD-10-CM | POA: Diagnosis not present

## 2024-03-06 ENCOUNTER — Encounter (HOSPITAL_BASED_OUTPATIENT_CLINIC_OR_DEPARTMENT_OTHER): Payer: Self-pay | Admitting: Student

## 2024-04-07 DIAGNOSIS — H5789 Other specified disorders of eye and adnexa: Secondary | ICD-10-CM | POA: Diagnosis not present

## 2024-04-07 DIAGNOSIS — H538 Other visual disturbances: Secondary | ICD-10-CM | POA: Diagnosis not present

## 2024-04-07 DIAGNOSIS — H04123 Dry eye syndrome of bilateral lacrimal glands: Secondary | ICD-10-CM | POA: Diagnosis not present

## 2024-05-07 ENCOUNTER — Encounter (HOSPITAL_BASED_OUTPATIENT_CLINIC_OR_DEPARTMENT_OTHER): Payer: Self-pay | Admitting: Student

## 2024-05-07 ENCOUNTER — Ambulatory Visit (INDEPENDENT_AMBULATORY_CARE_PROVIDER_SITE_OTHER): Payer: BC Managed Care – PPO | Admitting: Student

## 2024-05-07 VITALS — BP 133/87 | HR 88 | Temp 97.6°F | Resp 16 | Ht 68.0 in | Wt 236.0 lb

## 2024-05-07 DIAGNOSIS — Z0000 Encounter for general adult medical examination without abnormal findings: Secondary | ICD-10-CM | POA: Diagnosis not present

## 2024-05-07 DIAGNOSIS — F411 Generalized anxiety disorder: Secondary | ICD-10-CM

## 2024-05-07 DIAGNOSIS — R21 Rash and other nonspecific skin eruption: Secondary | ICD-10-CM | POA: Diagnosis not present

## 2024-05-07 MED ORDER — TRIAMCINOLONE ACETONIDE 0.1 % EX CREA: 1.0000 | TOPICAL_CREAM | Freq: Two times a day (BID) | CUTANEOUS | 0 refills | Status: DC

## 2024-05-07 MED ORDER — ESCITALOPRAM OXALATE 10 MG PO TABS
10.0000 mg | ORAL_TABLET | Freq: Every day | ORAL | 3 refills | Status: AC
Start: 2024-05-07 — End: ?

## 2024-05-07 NOTE — Assessment & Plan Note (Signed)
 Health maintenance is up to date with recent eye and dental exams. Scheduled for a colonoscopy in June. Current diet and exercise regimen focuses on weight management and overall health. - Encourage continued use of My Fitness Pal app for dietary tracking. - Plan to check cholesterol and thyroid  levels next year. - Schedule follow-up appointment in one year.

## 2024-05-07 NOTE — Patient Instructions (Signed)
Health Maintenance, Female Adopting a healthy lifestyle and getting preventive care are important in promoting health and wellness. Ask your health care provider about: The right schedule for you to have regular tests and exams. Things you can do on your own to prevent diseases and keep yourself healthy. What should I know about diet, weight, and exercise? Eat a healthy diet  Eat a diet that includes plenty of vegetables, fruits, low-fat dairy products, and lean protein. Do not eat a lot of foods that are high in solid fats, added sugars, or sodium. Maintain a healthy weight Body mass index (BMI) is used to identify weight problems. It estimates body fat based on height and weight. Your health care provider can help determine your BMI and help you achieve or maintain a healthy weight. Get regular exercise Get regular exercise. This is one of the most important things you can do for your health. Most adults should: Exercise for at least 150 minutes each week. The exercise should increase your heart rate and make you sweat (moderate-intensity exercise). Do strengthening exercises at least twice a week. This is in addition to the moderate-intensity exercise. Spend less time sitting. Even light physical activity can be beneficial. Watch cholesterol and blood lipids Have your blood tested for lipids and cholesterol at 49 years of age, then have this test every 5 years. Have your cholesterol levels checked more often if: Your lipid or cholesterol levels are high. You are older than 49 years of age. You are at high risk for heart disease. What should I know about cancer screening? Depending on your health history and family history, you may need to have cancer screening at various ages. This may include screening for: Breast cancer. Cervical cancer. Colorectal cancer. Skin cancer. Lung cancer. What should I know about heart disease, diabetes, and high blood pressure? Blood pressure and heart  disease High blood pressure causes heart disease and increases the risk of stroke. This is more likely to develop in people who have high blood pressure readings or are overweight. Have your blood pressure checked: Every 3-5 years if you are 18-39 years of age. Every year if you are 40 years old or older. Diabetes Have regular diabetes screenings. This checks your fasting blood sugar level. Have the screening done: Once every three years after age 40 if you are at a normal weight and have a low risk for diabetes. More often and at a younger age if you are overweight or have a high risk for diabetes. What should I know about preventing infection? Hepatitis B If you have a higher risk for hepatitis B, you should be screened for this virus. Talk with your health care provider to find out if you are at risk for hepatitis B infection. Hepatitis C Testing is recommended for: Everyone born from 1945 through 1965. Anyone with known risk factors for hepatitis C. Sexually transmitted infections (STIs) Get screened for STIs, including gonorrhea and chlamydia, if: You are sexually active and are younger than 49 years of age. You are older than 49 years of age and your health care provider tells you that you are at risk for this type of infection. Your sexual activity has changed since you were last screened, and you are at increased risk for chlamydia or gonorrhea. Ask your health care provider if you are at risk. Ask your health care provider about whether you are at high risk for HIV. Your health care provider may recommend a prescription medicine to help prevent HIV   infection. If you choose to take medicine to prevent HIV, you should first get tested for HIV. You should then be tested every 3 months for as long as you are taking the medicine. Pregnancy If you are about to stop having your period (premenopausal) and you may become pregnant, seek counseling before you get pregnant. Take 400 to 800  micrograms (mcg) of folic acid every day if you become pregnant. Ask for birth control (contraception) if you want to prevent pregnancy. Osteoporosis and menopause Osteoporosis is a disease in which the bones lose minerals and strength with aging. This can result in bone fractures. If you are 36 years old or older, or if you are at risk for osteoporosis and fractures, ask your health care provider if you should: Be screened for bone loss. Take a calcium or vitamin D supplement to lower your risk of fractures. Be given hormone replacement therapy (HRT) to treat symptoms of menopause. Follow these instructions at home: Alcohol use Do not drink alcohol if: Your health care provider tells you not to drink. You are pregnant, may be pregnant, or are planning to become pregnant. If you drink alcohol: Limit how much you have to: 0-1 drink a day. Know how much alcohol is in your drink. In the [place] one drink equals one 12 oz bottle of beer (355 mL), one 5 oz glass of wine (148 mL), or one 1 oz glass of hard liquor (44 mL). Lifestyle Do not use any products that contain nicotine or tobacco. These products include cigarettes, chewing tobacco, and vaping devices, such as e-cigarettes. If you need help quitting, ask your health care provider. Do not use street drugs. Do not share needles. Ask your health care provider for help if you need support or information about quitting drugs. General instructions Schedule regular health, dental, and eye exams. Stay current with your vaccines. Tell your health care provider if: You often feel depressed. You have ever been abused or do not feel safe at home. Summary Adopting a healthy lifestyle and getting preventive care are important in promoting health and wellness. Follow your health care provider's instructions about healthy diet, exercising, and getting tested or screened for diseases. Follow your health care provider's instructions on monitoring your  cholesterol and blood pressure. This information is not intended to replace advice given to you by your health care provider. Make sure you discuss any questions you have with your health care provider. Document Revised: 04/24/2021 Document Reviewed: 04/24/2021 Elsevier Patient Education  2024 Else[employer] Inc.     Why follow it? Research shows. Those who follow the Mediterranean diet have a reduced risk of heart disease  The diet is associated with a reduced incidence of Parkinson's and Alzheimer's diseases People following the diet may have longer life expectancies and lower rates of chronic diseases  The Dietary Guidelines for Americans recommends the Mediterranean diet as an eating plan to promote health and prevent disease  What Is the Mediterranean Diet?  Healthy eating plan based on typical foods and recipes of Mediterranean-style cooking The diet is primarily a plant based diet; these foods should make up a majority of meals   Starches - Plant based foods should make up a majority of meals - They are an important sources of vitamins, minerals, energy, antioxidants, and fiber - Choose whole grains, foods high in fiber and minimally processed items  - Typical grain sources include wheat, oats, barley, corn, brown rice, bulgar, farro, millet, polenta, couscous  - Various types of beans  include chickpeas, lentils, fava beans, black beans, white beans   Fruits  Veggies - Large quantities of antioxidant rich fruits & veggies; 6 or more servings  - Vegetables can be eaten raw or lightly drizzled with oil and cooked  - Vegetables common to the traditional Mediterranean Diet include: artichokes, arugula, beets, broccoli, brussel sprouts, cabbage, carrots, celery, collard greens, cucumbers, eggplant, kale, leeks, lemons, lettuce, mushrooms, okra, onions, peas, peppers, potatoes, pumpkin, radishes, rutabaga, shallots, spinach, sweet potatoes, turnips, zucchini - Fruits common to the Mediterranean  Diet include: apples, apricots, avocados, cherries, clementines, dates, figs, grapefruits, grapes, melons, nectarines, oranges, peaches, pears, pomegranates, strawberries, tangerines  Fats - Replace butter and margarine with healthy oils, such as olive oil, canola oil, and tahini  - Limit nuts to no more than a handful a day  - Nuts include walnuts, almonds, pecans, pistachios, pine nuts  - Limit or avoid candied, honey roasted or heavily salted nuts - Olives are central to the Mediterranean diet - can be eaten whole or used in a variety of dishes   Meats Protein - Limiting red meat: no more than a few times a month - When eating red meat: choose lean cuts and keep the portion to the size of deck of cards - Eggs: approx. 0 to 4 times a week  - Fish and lean poultry: at least 2 a week  - Healthy protein sources include, chicken, turkey, lean beef, lamb - Increase intake of seafood such as tuna, salmon, trout, mackerel, shrimp, scallops - Avoid or limit high fat processed meats such as sausage and bacon  Dairy - Include moderate amounts of low fat dairy products  - Focus on healthy dairy such as fat free yogurt, skim milk, low or reduced fat cheese - Limit dairy products higher in fat such as whole or 2% milk, cheese, ice cream  Alcohol - Moderate amounts of red wine is ok  - No more than 5 oz daily for women (all ages) and men older than age 65  - No more than 10 oz of wine daily for men younger than 65  Other - Limit sweets and other desserts  - Use herbs and spices instead of salt to flavor foods  - Herbs and spices common to the traditional Mediterranean Diet include: basil, bay leaves, chives, cloves, cumin, fennel, garlic, lavender, marjoram, mint, oregano, parsley, pepper, rosemary, sage, savory, sumac, tarragon, thyme   It's not just a diet, it's a lifestyle:  The Mediterranean diet includes lifestyle factors typical of those in the region  Foods, drinks and meals are best eaten with  others and savored Daily physical activity is important for overall good health This could be strenuous exercise like running and aerobics This could also be more leisurely activities such as walking, housework, yard-work, or taking the stairs Moderation is the key; a balanced and healthy diet accommodates most foods and drinks Consider portion sizes and frequency of consumption of certain foods   Meal Ideas & Options:  Breakfast:  Whole wheat toast or whole wheat English muffins with peanut butter & hard boiled egg Steel cut oats topped with apples & cinnamon and skim milk  Fresh fruit: banana, strawberries, melon, berries, peaches  Smoothies: strawberries, bananas, greek yogurt, peanut butter Low fat greek yogurt with blueberries and granola  Egg white omelet with spinach and mushrooms Breakfast couscous: whole wheat couscous, apricots, skim milk, cranberries  Sandwiches:  Hummus and grilled vegetables (peppers, zucchini, squash) on whole wheat bread   Grilled   chicken on whole wheat pita with lettuce, tomatoes, cucumbers or tzatziki  [place] salad on whole wheat bread: tuna salad made with greek yogurt, olives, red peppers, capers, green onions Garlic rosemary lamb pita: lamb sauted with garlic, rosemary, salt & pepper; add lettuce, cucumber, greek yogurt to pita - flavor with lemon juice and black pepper  Seafood:  Mediterranean grilled salmon, seasoned with garlic, basil, parsley, lemon juice and black pepper Shrimp, lemon, and spinach whole-grain pasta salad made with low fat greek yogurt  Seared scallops with lemon orzo  Seared tuna steaks seasoned salt, pepper, coriander topped with tomato mixture of olives, tomatoes, olive oil, minced garlic, parsley, green onions and cappers  Meats:  Herbed greek chicken salad with kalamata olives, cucumber, feta  Red bell peppers stuffed with spinach, bulgur, lean ground beef (or lentils) & topped with feta   Kebabs: skewers of chicken, tomatoes,  onions, zucchini, squash  [place] burgers: made with red onions, mint, dill, lemon juice, feta cheese topped with roasted red peppers Vegetarian Cucumber salad: cucumbers, artichoke hearts, celery, red onion, feta cheese, tossed in olive oil & lemon juice  Hummus and whole grain pita points with a greek salad (lettuce, tomato, feta, olives, cucumbers, red onion) Lentil soup with celery, carrots made with vegetable broth, garlic, salt and pepper  Tabouli salad: parsley, bulgur, mint, scallions, cucumbers, tomato, radishes, lemon juice, olive oil, salt and pepper.      American Heart Association (AHA) Exercise Recommendation  Being physically active is important to prevent heart disease and stroke, the nation's No. 1and No. 5killers. To improve overall cardiovascular health, we suggest at least 150 minutes per week of moderate exercise or 75 minutes per week of vigorous exercise (or a combination of moderate and vigorous activity). Thirty minutes a day, five times a week is an easy goal to remember. You will also experience benefits even if you divide your time into two or three segments of 10 to 15 minutes per day.  For people who would benefit from lowering their blood pressure or cholesterol, we recommend 40 minutes of aerobic exercise of moderate to vigorous intensity three to four times a week to lower the risk for heart attack and stroke.  Physical activity is anything that makes you move your body and burn calories.  This includes things like climbing stairs or playing sports. Aerobic exercises benefit your heart, and include walking, jogging, swimming or biking. Strength and stretching exercises are best for overall stamina and flexibility.  The simplest, positive change you can make to effectively improve your heart health is to start walking. It's enjoyable, free, easy, social and great exercise. A walking program is flexible and boasts high success rates because people can stick with it.  It's easy for walking to become a regular and satisfying part of life.   For Overall Cardiovascular Health: At least 30 minutes of moderate-intensity aerobic activity at least 5 days per week for a total of 150  OR  At least 25 minutes of vigorous aerobic activity at least 3 days per week for a total of 75 minutes; or a combination of moderate- and vigorous-intensity aerobic activity  AND  Moderate- to high-intensity muscle-strengthening activity at least 2 days per week for additional health benefits.  For Lowering Blood Pressure and Cholesterol An average 40 minutes of moderate- to vigorous-intensity aerobic activity 3 or 4 times per week  What if I can't make it to the time goal? Something is always better than nothing! And everyone has to  start somewhere. Even if you've been sedentary for years, today is the day you can begin to make healthy changes in your life. If you don't think you'll make it for 30 or 40 minutes, set a reachable goal for today. You can work up toward your overall goal by increasing your time as you get stronger. Don't let all-or-nothing thinking rob you of doing what you can every day.  Source:http://www.heart.org

## 2024-05-07 NOTE — Assessment & Plan Note (Signed)
 Chronic, not at goal with continued symptoms of anxiety. - Increase Lexapro  to 10 mg daily

## 2024-05-07 NOTE — Progress Notes (Signed)
 Complete physical exam  Patient: Tammy Gibson   DOB: 1976-02-17   48 y.o. Female  MRN: [id number]  Subjective:     Chief Complaint  Patient presents with   Annual Exam    Annual exam today. Wanted gait looked out. Husband said right foot is moving inwards.     Discussed the use of AI scribe software for clinical note transcription with the patient, who gave verbal consent to proceed.  History of Present Illness   Tammy Gibson is a 48 y.o. female who presents today for a complete physical exam. She reports consuming a general diet.Has made major dietary changes recently. Has been trying to walk 10-15,000 steps per day. She generally feels well. She reports sleeping well. She does not have additional problems to discuss today.   She has noticed her right foot moving inward while walking, leading to occasional tripping, without associated pain. This issue was initially observed by her husband and has persisted.  She has a history of anxiety and is currently taking 5 mg of Lexapro  daily. Despite this, she experiences high levels of anxiety, particularly while driving and in stressful situations. She has a low tolerance for stress and difficulty concentrating, which a colleague suggested might be adult ADHD.  She is experiencing difficulty losing weight despite a diet of fresh foods, including [place] breast and steamed vegetables, and walking 10,000 to 15,000 steps daily. Her husband has lost weight on the same regimen, but she has not. She attributes this to hormonal changes associated with menopause, experiencing symptoms such as hot flashes and increased hunger. She does not consume junk food or sodas and has reduced her intake of sweet tea, opting for coffee and occasionally a Monster energy drink.  She reports improved sleep, averaging seven hours per night, attributed to increased physical activity. However, she struggles to fall asleep initially and experiences snoring and drooling. Hot  flashes occasionally disrupt her sleep, causing her to wake up sweating.  She experiences a rash and itching when she exercises or is exposed to heat, affecting her chest, back, and legs. This is sometimes associated with high anxiety. She has tried Benadryl and magnesium spray for relief, with limited success.       Most recent fall risk assessment:    12/02/2023    1:39 PM  Fall Risk   Falls in the past year? 0  Number falls in past yr: 0  Injury with Fall? 0  Risk for fall due to : No Fall Risks  Follow up Falls evaluation completed     Most recent depression screenings:    05/07/2024    8:56 AM 12/30/2023    1:11 PM  PHQ 2/9 Scores  PHQ - 2 Score 0 0  PHQ- 9 Score  7    Vision:Within last year and Dental: No current dental problems and Receives regular dental care  Patient Active Problem List   Diagnosis Date Noted   Adult general medical exam 05/07/2024   GAD (generalized anxiety disorder) 02/10/2024   Benign essential microscopic hematuria 02/10/2024   Dry skin 12/30/2023   Itching 12/30/2023   Anxiety 12/30/2023   Class 2 obesity without serious comorbidity with body mass index (BMI) of 36.0 to 36.9 in adult 12/02/2023   Hoarseness 08/14/2021   Bilateral carpal tunnel syndrome 05/05/2021   Acid reflux 10/24/2018   H/O Graves' disease 04/24/2018   Insomnia 04/24/2018   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   CKD (chronic kidney disease), stage II 10/24/2017  Obesity (BMI 30.0-34.9) 08/21/2015   Essential hypertension 02/17/2014   Medication management 02/17/2014   History of elevated glucose 02/17/2014   Endometriosis 01/01/2014   Acquired hypothyroidism    Hyperlipidemia    Vitamin D  deficiency    Past Medical History:  Diagnosis Date   Abdominal pain    Adenomyosis    AKI (acute kidney injury) (HCC) 12/30/2023   Bell's palsy    Dysmenorrhea    Dyspareunia    Elevated LFTs    Endometriosis    Hyperlipidemia    Infertility, female    Thyroid  disease     pt was hypo now hyperthyroid   Urinary incontinence    Vitamin D  deficiency    Social History   Tobacco Use   Smoking status: Former    Current packs/day: 0.00    Average packs/day: 0.3 packs/day for 26.0 years (6.5 ttl pk-yrs)    Types: Cigarettes    Start date: 98    Quit date: 2018    Years since quitting: 7.3    Passive exposure: Past   Smokeless tobacco: Never   Tobacco comments:    smokes about 2-3 cigarettes a day  Vaping Use   Vaping status: Former  Substance Use Topics   Alcohol use: No    Alcohol/week: 0.0 standard drinks of alcohol    Comment: Patient stopped alcohol due to elevated liver enzymes.   Drug use: No   Allergies  Allergen Reactions   Chantix [Varenicline Tartrate] Swelling    Really bad nightmares      Patient Care Team: Rosamond Andress T, PA-C as PCP - General (Physician Assistant)   Outpatient Medications Prior to Visit  Medication Sig   Acetaminophen (TYLENOL) 325 MG CAPS Take 325 mg by mouth as needed.   atorvastatin  (LIPITOR) 40 MG tablet Take 1 tablet (40 mg total) by mouth daily.   Cholecalciferol (VITAMIN D3) 10000 UNITS capsule Take 10,000 Units by mouth. Three days a week   hydrOXYzine  (VISTARIL ) 25 MG capsule Take 1 capsule (25 mg total) by mouth every 8 (eight) hours as needed for anxiety or itching.   levothyroxine  (SYNTHROID ) 150 MCG tablet Take 1 tablet (150 mcg total) by mouth daily before breakfast. At least 30 minutes before   [DISCONTINUED] escitalopram  (LEXAPRO ) 5 MG tablet TAKE 1 TABLET (5 MG TOTAL) BY MOUTH DAILY.   No facility-administered medications prior to visit.    ROS  Per HPI     Objective:     BP 133/87   Pulse 88   Temp 97.6 F (36.4 C) (Oral)   Resp 16   Ht 5\' 8"  (1.727 m)   Wt 236 lb (107 kg)   LMP 04/27/2024 (Approximate)   SpO2 96%   BMI 35.88 kg/m  BP Readings from Last 3 Encounters:  05/07/24 133/87  02/10/24 122/82  12/30/23 117/80   Wt Readings from Last 3 Encounters:  05/07/24  236 lb (107 kg)  02/10/24 227 lb 11.2 oz (103.3 kg)  12/30/23 228 lb 9.6 oz (103.7 kg)      Physical Exam Constitutional:      General: She is not in acute distress.    Appearance: Normal appearance. She is not ill-appearing or diaphoretic.  HENT:     Head: Normocephalic and atraumatic.     Right Ear: Tympanic membrane, ear canal and external ear normal.     Left Ear: Tympanic membrane, ear canal and external ear normal.     Nose: Nose normal.     Mouth/Throat:  Mouth: Mucous membranes are moist.     Pharynx: Oropharynx is clear.  Eyes:     General: No scleral icterus.       Right eye: No discharge.        Left eye: No discharge.     Extraocular Movements: Extraocular movements intact.     Conjunctiva/sclera: Conjunctivae normal.     Pupils: Pupils are equal, round, and reactive to light.  Neck:     Thyroid : No thyroid  mass, thyromegaly or thyroid  tenderness.     Vascular: No carotid bruit.  Cardiovascular:     Rate and Rhythm: Normal rate and regular rhythm.     Pulses: Normal pulses.     Heart sounds: Normal heart sounds. No murmur heard.    No friction rub. No gallop.  Pulmonary:     Effort: Pulmonary effort is normal.     Breath sounds: Normal breath sounds. No wheezing, rhonchi or rales.  Chest:     Chest wall: No tenderness.  Abdominal:     General: Bowel sounds are normal. There is no distension.     Palpations: Abdomen is soft.     Tenderness: There is no abdominal tenderness. There is no guarding.  Musculoskeletal:        General: No swelling, deformity or signs of injury.     Cervical back: Neck supple.     Right lower leg: No edema.     Left lower leg: No edema.  Lymphadenopathy:     Cervical: No cervical adenopathy.     Right cervical: No superficial or posterior cervical adenopathy.    Left cervical: No superficial cervical adenopathy.  Skin:    Coloration: Skin is not jaundiced.     Findings: No rash.  Neurological:     General: No focal deficit  present.     Mental Status: She is alert and oriented to person, place, and time.     Motor: No weakness.     Gait: Gait (though slight inward turn of R foot) normal.  Psychiatric:        Behavior: Behavior normal.      No results found for any visits on 05/07/24. Last CBC Lab Results  Component Value Date   WBC 6.1 12/02/2023   HGB 12.4 12/02/2023   HCT 39.1 12/02/2023   MCV 91 12/02/2023   MCH 29.0 12/02/2023   RDW 14.0 12/02/2023   PLT 258 12/02/2023   Last metabolic panel Lab Results  Component Value Date   GLUCOSE 92 12/30/2023   NA 142 12/30/2023   K 4.4 12/30/2023   CL 104 12/30/2023   CO2 24 12/30/2023   BUN 15 12/30/2023   CREATININE 0.78 12/30/2023   EGFR 94 12/30/2023   CALCIUM  9.5 12/30/2023   PROT 7.0 12/30/2023   ALBUMIN 4.4 12/30/2023   LABGLOB 2.6 12/30/2023   BILITOT 0.4 12/30/2023   ALKPHOS 93 12/30/2023   AST 26 12/30/2023   ALT 32 12/30/2023   ANIONGAP 8 12/14/2017   Last lipids Lab Results  Component Value Date   CHOL 140 12/30/2023   HDL 57 12/30/2023   LDLCALC 67 12/30/2023   TRIG 83 12/30/2023   CHOLHDL 2.5 12/30/2023   Last hemoglobin A1c Lab Results  Component Value Date   HGBA1C 5.7 (H) 12/02/2023   Last thyroid  functions Lab Results  Component Value Date   TSH 1.100 12/30/2023        Assessment & Plan:    Routine Health Maintenance and Physical Exam  Health Maintenance  Topic Date Due   Colon Cancer Screening  06/05/2024*   DTaP/Tdap/Td vaccine (2 - Tdap) 08/07/2024*   Pap with HPV screening  02/16/2025*   COVID-19 Vaccine (1) 05/07/2025*   Flu Shot  07/17/2024   Hepatitis C Screening  Completed   HIV Screening  Completed   HPV Vaccine  Aged Out   Meningitis B Vaccine  Aged Out  *Topic was postponed. The date shown is not the original due date.    Encouraged her to engage in regular exercise appropriate for her age and condition.  Adult general medical exam Assessment & Plan: Health maintenance is up to  date with recent eye and dental exams. Scheduled for a colonoscopy in June. Current diet and exercise regimen focuses on weight management and overall health. - Encourage continued use of My Fitness Pal app for dietary tracking. - Plan to check cholesterol and thyroid  levels next year. - Schedule follow-up appointment in one year.   Orders: -     CBC with Differential/Platelet -     Comprehensive metabolic panel with GFR -     Hemoglobin A1c  GAD (generalized anxiety disorder) Assessment & Plan: Chronic, not at goal with continued symptoms of anxiety. - Increase Lexapro  to 10 mg daily  Orders: -     Escitalopram  Oxalate; Take 1 tablet (10 mg total) by mouth daily.  Dispense: 90 tablet; Refill: 3  Rash -     Triamcinolone  Acetonide; Apply 1 Application topically 2 (two) times daily.  Dispense: 45 g; Refill: 0   Return in about 1 year (around 05/07/2025) for Annual Physical.     Kierra Jezewski T Tyne Banta, PA-C

## 2024-05-08 ENCOUNTER — Ambulatory Visit (HOSPITAL_BASED_OUTPATIENT_CLINIC_OR_DEPARTMENT_OTHER): Payer: Self-pay | Admitting: Student

## 2024-05-08 ENCOUNTER — Institutional Professional Consult (permissible substitution) (HOSPITAL_BASED_OUTPATIENT_CLINIC_OR_DEPARTMENT_OTHER): Payer: BC Managed Care – PPO | Admitting: Internal Medicine

## 2024-05-08 LAB — CBC WITH DIFFERENTIAL/PLATELET
Basophils Absolute: 0.1 10*3/uL (ref 0.0–0.2)
Basos: 2 %
EOS (ABSOLUTE): 0.3 10*3/uL (ref 0.0–0.4)
Eos: 5 %
Hematocrit: 43.9 % (ref 34.0–46.6)
Hemoglobin: 13.3 g/dL (ref 11.1–15.9)
Immature Grans (Abs): 0 10*3/uL (ref 0.0–0.1)
Immature Granulocytes: 1 %
Lymphocytes Absolute: 1.9 10*3/uL (ref 0.7–3.1)
Lymphs: 29 %
MCH: 26.3 pg — ABNORMAL LOW (ref 26.6–33.0)
MCHC: 30.3 g/dL — ABNORMAL LOW (ref 31.5–35.7)
MCV: 87 fL (ref 79–97)
Monocytes Absolute: 0.6 10*3/uL (ref 0.1–0.9)
Monocytes: 9 %
Neutrophils Absolute: 3.7 10*3/uL (ref 1.4–7.0)
Neutrophils: 54 %
Platelets: 309 10*3/uL (ref 150–450)
RBC: 5.05 x10E6/uL (ref 3.77–5.28)
RDW: 13.8 % (ref 11.7–15.4)
WBC: 6.7 10*3/uL (ref 3.4–10.8)

## 2024-05-08 LAB — COMPREHENSIVE METABOLIC PANEL WITH GFR
ALT: 24 IU/L (ref 0–32)
AST: 22 IU/L (ref 0–40)
Albumin: 4 g/dL (ref 3.9–4.9)
Alkaline Phosphatase: 108 IU/L (ref 44–121)
BUN/Creatinine Ratio: 20 (ref 9–23)
BUN: 14 mg/dL (ref 6–24)
Bilirubin Total: 0.2 mg/dL (ref 0.0–1.2)
CO2: 22 mmol/L (ref 20–29)
Calcium: 9.8 mg/dL (ref 8.7–10.2)
Chloride: 105 mmol/L (ref 96–106)
Creatinine, Ser: 0.7 mg/dL (ref 0.57–1.00)
Globulin, Total: 2.3 g/dL (ref 1.5–4.5)
Glucose: 83 mg/dL (ref 70–99)
Potassium: 4.9 mmol/L (ref 3.5–5.2)
Sodium: 141 mmol/L (ref 134–144)
Total Protein: 6.3 g/dL (ref 6.0–8.5)
eGFR: 107 mL/min/{1.73_m2} (ref >59)

## 2024-05-08 LAB — HEMOGLOBIN A1C
Est. average glucose Bld gHb Est-mCnc: 126 mg/dL
Hgb A1c MFr Bld: 6 % — ABNORMAL HIGH (ref 4.8–5.6)

## 2024-09-11 ENCOUNTER — Other Ambulatory Visit (HOSPITAL_BASED_OUTPATIENT_CLINIC_OR_DEPARTMENT_OTHER): Payer: Self-pay | Admitting: Student

## 2024-09-11 DIAGNOSIS — R21 Rash and other nonspecific skin eruption: Secondary | ICD-10-CM

## 2024-12-18 ENCOUNTER — Other Ambulatory Visit (HOSPITAL_BASED_OUTPATIENT_CLINIC_OR_DEPARTMENT_OTHER): Payer: Self-pay | Admitting: Student

## 2024-12-18 DIAGNOSIS — E039 Hypothyroidism, unspecified: Secondary | ICD-10-CM

## 2025-05-07 ENCOUNTER — Encounter (HOSPITAL_BASED_OUTPATIENT_CLINIC_OR_DEPARTMENT_OTHER): Admitting: Student
